# Patient Record
Sex: Male | Born: 1960
Health system: Southern US, Community
[De-identification: ages and names within clinical notes are randomized; demographics above are authoritative.]

## PROBLEM LIST (undated history)

## (undated) DIAGNOSIS — K219 Gastro-esophageal reflux disease without esophagitis: Secondary | ICD-10-CM

## (undated) DIAGNOSIS — K449 Diaphragmatic hernia without obstruction or gangrene: Secondary | ICD-10-CM

## (undated) DIAGNOSIS — K222 Esophageal obstruction: Secondary | ICD-10-CM

## (undated) HISTORY — DX: Esophageal obstruction: K22.2

## (undated) HISTORY — PX: APPENDECTOMY: SHX54

## (undated) HISTORY — DX: Diaphragmatic hernia without obstruction or gangrene: K44.9

## (undated) HISTORY — PX: WRIST SURGERY: SHX841

## (undated) HISTORY — DX: Gastro-esophageal reflux disease without esophagitis: K21.9

---

## 2003-03-29 ENCOUNTER — Ambulatory Visit (HOSPITAL_BASED_OUTPATIENT_CLINIC_OR_DEPARTMENT_OTHER): Admission: RE | Admit: 2003-03-29 | Discharge: 2003-03-29 | Payer: Self-pay | Admitting: Orthopedic Surgery

## 2005-09-05 ENCOUNTER — Emergency Department: Payer: Self-pay | Admitting: Emergency Medicine

## 2005-09-13 ENCOUNTER — Ambulatory Visit: Payer: Self-pay | Admitting: Gastroenterology

## 2005-09-20 ENCOUNTER — Ambulatory Visit: Payer: Self-pay | Admitting: Gastroenterology

## 2011-01-24 ENCOUNTER — Ambulatory Visit: Payer: Self-pay | Admitting: Family Medicine

## 2011-02-14 ENCOUNTER — Ambulatory Visit: Payer: Self-pay | Admitting: Family Medicine

## 2011-07-16 ENCOUNTER — Encounter: Payer: Self-pay | Admitting: Gastroenterology

## 2011-07-24 ENCOUNTER — Encounter: Payer: Self-pay | Admitting: *Deleted

## 2011-07-30 ENCOUNTER — Encounter: Payer: Self-pay | Admitting: Gastroenterology

## 2011-07-30 ENCOUNTER — Ambulatory Visit (INDEPENDENT_AMBULATORY_CARE_PROVIDER_SITE_OTHER): Payer: BC Managed Care – PPO | Admitting: Gastroenterology

## 2011-07-30 VITALS — BP 124/76 | HR 68 | Ht 70.0 in | Wt 215.0 lb

## 2011-07-30 DIAGNOSIS — K219 Gastro-esophageal reflux disease without esophagitis: Secondary | ICD-10-CM

## 2011-07-30 DIAGNOSIS — T17308A Unspecified foreign body in larynx causing other injury, initial encounter: Secondary | ICD-10-CM

## 2011-07-30 DIAGNOSIS — T17910A Gastric contents in respiratory tract, part unspecified causing asphyxiation, initial encounter: Secondary | ICD-10-CM | POA: Insufficient documentation

## 2011-07-30 DIAGNOSIS — R1110 Vomiting, unspecified: Secondary | ICD-10-CM

## 2011-07-30 MED ORDER — METOCLOPRAMIDE HCL 10 MG PO TABS: 10.0000 mg | ORAL_TABLET | Freq: Every day | ORAL | Status: DC

## 2011-07-30 MED ORDER — DEXLANSOPRAZOLE 60 MG PO CPDR: 60.0000 mg | DELAYED_RELEASE_CAPSULE | Freq: Every day | ORAL | Status: AC

## 2011-07-30 NOTE — Progress Notes (Signed)
History of Present Illness:  This is a very pleasant 51 year old Caucasian male who turns 51 in one month. He has had 9 months of rather classical regurgitation and acid reflux, but is has severe nocturnal problems with awakening and aspiration associated with coughing and low-grade fever. The appearance had several episodes of pneumonia over the last year by primary care. So he has classic burning substernal chest pain and postprandial regurgitation without dysphagia. Chart documents over 9 years of acid reflux with recurrent peptic strictures requiring dilation 2004 in 2007. He denies dysphagia or painful swallowing at this time. His appetite is good and his weight is stable. The patient also denies lower gastrointestinal, or hepatobiliary symptoms. He does not smoke, use alcohol or NSAIDs. Family history is noncontributory.  I have reviewed this patient's present history, medical and surgical past history, allergies and medications.     ROS: The remainder of the 10 point ROS is negative.. No symptoms of Raynaud's phenomenon l or cardiovascular or neuromuscular problems.     Physical Exam: Blood pressure 124/76, pulse 66, and BMI 31. General well developed well nourished patient in no acute distress, appearing his stated age Eyes PERRLA, no icterus, fundoscopic exam per opthamologist Skin no lesions noted Neck supple, no adenopathy, no thyroid enlargement, no tenderness Chest clear to percussion and auscultation Heart no significant murmurs, gallops or rubs noted Abdomen no hepatosplenomegaly masses or tenderness, BS normal.  Extremities no acute joint lesions, edema, phlebitis or evidence of cellulitis. Neurologic patient oriented x 3, cranial nerves intact, no focal neurologic deficits noted. Psychological mental status normal and normal affect.  Assessment and plan: Chronic regurgitation with recent aspiration and nocturnal worsening of his GERD. I have placed him on Dexilant 60 mg before  supper with Reglan 10 mg at bedtime, reviewed a strict anti-reflex regime with the patient, he saw our patient education video on acid reflux and its management, and will see me in one month's time. He probably will need repeat endoscopy, colonoscopy for polyp screening, and perhaps esophageal manometry to exclude a chronic esophageal motility disorder. I am concerned about his recurrent aspiration, we may need to consider fundoplication surgery.  Encounter Diagnosis  Name Primary?  . GERD (gastroesophageal reflux disease) Yes

## 2011-07-30 NOTE — Patient Instructions (Signed)
Stop taking your Protonix. Start taking Dexilant 20-30 minutes before supper, samples have been given. Reglan has been sent to your pharmacy. You have watched the movie on GERD today. Return to see Dr Jarold Motto in 1 month.

## 2011-09-10 ENCOUNTER — Ambulatory Visit: Payer: BC Managed Care – PPO | Admitting: Gastroenterology

## 2012-02-28 ENCOUNTER — Encounter: Payer: Self-pay | Admitting: *Deleted

## 2012-03-03 ENCOUNTER — Ambulatory Visit (INDEPENDENT_AMBULATORY_CARE_PROVIDER_SITE_OTHER): Payer: 59 | Admitting: Gastroenterology

## 2012-03-03 ENCOUNTER — Encounter: Payer: Self-pay | Admitting: Gastroenterology

## 2012-03-03 VITALS — BP 90/66 | HR 88 | Ht 69.0 in | Wt 211.1 lb

## 2012-03-03 DIAGNOSIS — K219 Gastro-esophageal reflux disease without esophagitis: Secondary | ICD-10-CM

## 2012-03-03 DIAGNOSIS — Z1211 Encounter for screening for malignant neoplasm of colon: Secondary | ICD-10-CM

## 2012-03-03 MED ORDER — MOVIPREP 100 G PO SOLR: 1.0000 | Freq: Once | ORAL | Status: DC

## 2012-03-03 NOTE — Patient Instructions (Signed)
You have been scheduled for an endoscopy and colonoscopy with propofol. Please follow the written instructions given to you at your visit today. Please pick up your prep at the pharmacy within the next 1-3 days. If you use inhalers (even only as needed), please bring them with you on the day of your procedure. CC: Dr Julieanne Manson

## 2012-03-03 NOTE — Progress Notes (Signed)
History of Present Illness: This is a 51 year old with chronic GERD doing well currently on Protonix 40 mg in the morning. Follow for Reglan at bedtime was not helpful. He is 100% improved from his previous clinic visit, and denies dysphagia or other gastrointestinal or hepatobiliary problems. He has not had previous screening colonoscopy. Family history is noncontributory.    Current Medications, Allergies, Past Medical History, Past Surgical History, Family History and Social History were reviewed in [employer] record.   Assessment and plan: Chronic GERD doing well on PPI therapy along with chronic reflux regime which was again reviewed with the patient. He is due for screening colonoscopy which has been scheduled, and we also will do screening endoscopy to exclude Barrett's mucosa. Protonix prescription refilled. Encounter Diagnoses  Name Primary?  . Screening for colon cancer Yes  . GERD (gastroesophageal reflux disease)

## 2012-03-09 ENCOUNTER — Ambulatory Visit (AMBULATORY_SURGERY_CENTER): Payer: 59 | Admitting: Gastroenterology

## 2012-03-09 ENCOUNTER — Encounter: Payer: Self-pay | Admitting: Gastroenterology

## 2012-03-09 VITALS — BP 107/60 | HR 74 | Temp 96.9°F | Resp 20 | Ht 69.0 in | Wt 211.0 lb

## 2012-03-09 DIAGNOSIS — Z1211 Encounter for screening for malignant neoplasm of colon: Secondary | ICD-10-CM

## 2012-03-09 DIAGNOSIS — K219 Gastro-esophageal reflux disease without esophagitis: Secondary | ICD-10-CM

## 2012-03-09 MED ORDER — SODIUM CHLORIDE 0.9 % IV SOLN: 500.0000 mL | INTRAVENOUS | Status: DC

## 2012-03-09 MED ORDER — PANTOPRAZOLE SODIUM 40 MG PO TBEC: 40.0000 mg | DELAYED_RELEASE_TABLET | Freq: Every day | ORAL | Status: DC

## 2012-03-09 NOTE — Progress Notes (Signed)
Patient did not experience any of the following events: a burn prior to discharge; a fall within the facility; wrong site/side/patient/procedure/implant event; or a hospital transfer or hospital admission upon discharge from the facility. (G8907) Patient did not have preoperative order for IV antibiotic SSI prophylaxis. (G8918)  

## 2012-03-09 NOTE — Progress Notes (Signed)
The pt tolerated the colonoscopy very well. Maw   

## 2012-03-09 NOTE — Patient Instructions (Addendum)
Findings:  Normal Colon, Small Hiatal Hernia Recommendations:  Continue current meds, repeat colonoscopy in 10 years  YOU HAD AN ENDOSCOPIC PROCEDURE TODAY AT THE [facility]: Refer to the procedure report that was given to you for any specific questions about what was found during the examination.  If the procedure report does not answer your questions, please call your gastroenterologist to clarify.  If you requested that your care partner not be given the details of your procedure findings, then the procedure report has been included in a sealed envelope for you to review at your convenience later.  YOU SHOULD EXPECT: Some feelings of bloating in the abdomen. Passage of more gas than usual.  Walking can help get rid of the air that was put into your GI tract during the procedure and reduce the bloating. If you had a lower endoscopy (such as a colonoscopy or flexible sigmoidoscopy) you may notice spotting of blood in your stool or on the toilet paper. If you underwent a bowel prep for your procedure, then you may not have a normal bowel movement for a few days.  DIET: Your first meal following the procedure should be a light meal and then it is ok to progress to your normal diet.  A half-sandwich or bowl of soup is an example of a good first meal.  Heavy or fried foods are harder to digest and may make you feel nauseous or bloated.  Likewise meals heavy in dairy and vegetables can cause extra gas to form and this can also increase the bloating.  Drink plenty of fluids but you should avoid alcoholic beverages for 24 hours.  ACTIVITY: Your care partner should take you home directly after the procedure.  You should plan to take it easy, moving slowly for the rest of the day.  You can resume normal activity the day after the procedure however you should NOT DRIVE or use heavy machinery for 24 hours (because of the sedation medicines used during the test).    SYMPTOMS TO REPORT IMMEDIATELY: A  gastroenterologist can be reached at any hour.  During normal business hours, 8:30 AM to 5:00 PM Monday through Friday, call [phone].  After hours and on weekends, please call the GI answering service at [phone] who will take a message and have the physician on call contact you.   Following lower endoscopy (colonoscopy or flexible sigmoidoscopy):  Excessive amounts of blood in the stool  Significant tenderness or worsening of abdominal pains  Swelling of the abdomen that is new, acute  Fever of 100F or higher  Following upper endoscopy (EGD)  Vomiting of blood or coffee ground material  New chest pain or pain under the shoulder blades  Painful or persistently difficult swallowing  New shortness of breath  Fever of 100F or higher  Black, tarry-looking stools  FOLLOW UP: If any biopsies were taken you will be contacted by phone or by letter within the next 1-3 weeks.  Call your gastroenterologist if you have not heard about the biopsies in 3 weeks.  Our staff will call the home number listed on your records the next business day following your procedure to check on you and address any questions or concerns that you may have at that time regarding the information given to you following your procedure. This is a courtesy call and so if there is no answer at the home number and we have not heard from you through the emergency physician on call, we will  assume that you have returned to your regular daily activities without incident.  SIGNATURES/CONFIDENTIALITY: You and/or your care partner have signed paperwork which will be entered into your electronic medical record.  These signatures attest to the fact that that the information above on your After Visit Summary has been reviewed and is understood.  Full responsibility of the confidentiality of this discharge information lies with you and/or your care-partner.   Please follow all discharge instructions given to you by the recovery  room nurse. If you have any questions or problems after discharge please call one of the numbers listed above. You will receive a phone call in the am to see how you are doing and answer any questions you may have. Thank you for choosing [facility] for your health care needs.

## 2012-03-09 NOTE — Op Note (Signed)
[facility] [address] [place], [address]   ENDOSCOPY PROCEDURE REPORT  PATIENT: Gregory, Avila  MR#: [id number] BIRTHDATE: December 30, 1960 , 51  yrs. old GENDER: Male ENDOSCOPIST:Kendal Raffo Hale Bogus, MD, [facility] REFERRED BY: PROCEDURE DATE:  03/09/2012 PROCEDURE:   EGD, screening ASA CLASS:    Class II INDICATIONS: chronic GERD rule out Barrett's mucosa. MEDICATION: There was residual sedation effect present from prior procedure and Propofol (Diprivan) 140 mg TOPICAL ANESTHETIC:  DESCRIPTION OF PROCEDURE:   After the risks and benefits of the procedure were explained, informed consent was obtained.  The LB GIF-H180 [id number]  endoscope was introduced through the mouth  and advanced to the second portion of the duodenum .  The instrument was slowly withdrawn as the mucosa was fully examined.    The  endoscope was easily inserted into the second duodenum. Examination of the duodenum and stomach was unremarkable.  a small hiatal hernia was observed.  Withdrawal of the endoscope showed no evidence of Barrett's mucosa. or erosive esophagitis.  Patient extubated without difficulty and returned to the recovery for observation.   .     Retroflexed views revealed no abnormalities. The scope was then withdrawn from the patient and the procedure completed.  COMPLICATIONS: There were no complications.   ENDOSCOPIC IMPRESSION: 1.  Theendoscope was easily inserted into the second duodenum. examination of the duodenum and stomach was unremarkable.  a small hiatal hernia was observed.  withdrawal of the endoscope showed no evidence of Barrett's mucosa. 2. Treated GERD RECOMMENDATIONS: 1.  continue current medications 2.  continue PPI    _______________________________ eSigned:  Mardella Layman, MD, [facility] 03/09/2012 3:45 PM   antireflux   PATIENT NAME:  Gregory, Avila MR#: [id number]

## 2012-03-09 NOTE — Op Note (Signed)
[facility] [address] [place], [address]   COLONOSCOPY PROCEDURE REPORT  PATIENT: Gregory Avila, Gregory Avila  MR#: [id number] BIRTHDATE: 10/03/1960 , 51  yrs. old GENDER: Male ENDOSCOPIST: Mardella Layman, MD, [facility] REFERRED BY: PROCEDURE DATE:  03/09/2012 PROCEDURE:   Colonoscopy, screening ASA CLASS:   Class II INDICATIONS:average risk patient for colon cancer. MEDICATIONS: Propofol (Diprivan) 260 mg IV  DESCRIPTION OF PROCEDURE:   After the risks and benefits and of the procedure were explained, informed consent was obtained.  A digital rectal exam revealed no abnormalities of the rectum.    The LB CF-H180AL [id number]  endoscope was introduced through the anus and advanced to the cecum, which was identified by both the appendix and ileocecal valve .  The quality of the prep was excellent, using MoviPrep .  The instrument was then slowly withdrawn as the colon was fully examined.     COLON FINDINGS: A normal appearing cecum, ileocecal valve, and appendiceal orifice were identified.  The ascending, hepatic flexure, transverse, splenic flexure, descending, sigmoid colon and rectum appeared unremarkable.  No polyps or cancers were seen. Retroflexed views revealed no abnormalities.     The scope was then withdrawn from the patient and the procedure completed.  COMPLICATIONS: There were no complications. ENDOSCOPIC IMPRESSION: Normal colon,no polyps or cancer  RECOMMENDATIONS: Continue current colorectal screening recommendations for "routine risk" patients with a repeat colonoscopy in 10 years.   REPEAT EXAM:  XB:[id number] Sullivan Lone, MD  _______________________________ eSigned:  Mardella Layman, MD, [facility] 03/09/2012 3:34 PM

## 2012-03-09 NOTE — Progress Notes (Signed)
The pt tolerated the egd very well. Maw   

## 2012-03-10 ENCOUNTER — Telehealth: Payer: Self-pay | Admitting: *Deleted

## 2012-03-10 NOTE — Telephone Encounter (Signed)
  Follow up Call-  Call back number 03/09/2012  Post procedure Call Back phone  # [phone]  Permission to leave phone message Yes     Patient questions:  Do you have a fever, pain , or abdominal swelling? no Pain Score  0 *  Have you tolerated food without any problems? yes  Have you been able to return to your normal activities? yes  Do you have any questions about your discharge instructions: Diet   no Medications  no Follow up visit  no  Do you have questions or concerns about your Care? no  Actions: * If pain score is 4 or above: No action needed, pain <4.

## 2013-03-08 ENCOUNTER — Telehealth: Payer: Self-pay | Admitting: Cardiology

## 2013-03-08 ENCOUNTER — Emergency Department: Payer: Self-pay | Admitting: Emergency Medicine

## 2013-03-08 LAB — CBC
HCT: 42.1 % (ref 40.0–52.0)
HGB: 14.9 g/dL (ref 13.0–18.0)
MCHC: 35.5 g/dL (ref 32.0–36.0)
MCV: 83 fL (ref 80–100)
RDW: 13 % (ref 11.5–14.5)
WBC: 6.2 10*3/uL (ref 3.8–10.6)

## 2013-03-08 LAB — COMPREHENSIVE METABOLIC PANEL
Albumin: 3.7 g/dL (ref 3.4–5.0)
Alkaline Phosphatase: 97 U/L (ref 50–136)
Anion Gap: 6 — ABNORMAL LOW (ref 7–16)
BUN: 19 mg/dL — ABNORMAL HIGH (ref 7–18)
Calcium, Total: 8.9 mg/dL (ref 8.5–10.1)
Chloride: 108 mmol/L — ABNORMAL HIGH (ref 98–107)
Co2: 25 mmol/L (ref 21–32)
EGFR (African American): >60
Glucose: 109 mg/dL — ABNORMAL HIGH (ref 65–99)
Osmolality: 280 (ref 275–301)
SGOT(AST): 30 U/L (ref 15–37)
SGPT (ALT): 28 U/L (ref 12–78)
Total Protein: 7.2 g/dL (ref 6.4–8.2)

## 2013-03-08 LAB — TROPONIN I: Troponin-I: <0.02 ng/mL

## 2013-03-08 NOTE — Telephone Encounter (Signed)
I notified [place] office that patient going to [facility]

## 2013-03-08 NOTE — Telephone Encounter (Signed)
Received call directly from [facility] from patient's wife who states patient is having left-sided chest pain that began at 0100 today.  Patient got on the phone and described pain as woke up to a "squeezing" sensation in his chest.  Patient states pain radiated into his neck, left shoulder and down left arm.  Patient states his wife gave him 4 baby ASA and the pain eased off some.  Patient states pain is currently worse than it was earlier this morning and is described as a dull ache and throb and is also felt in his rib cage.  Patient rates pain 2 on 0-10 scale and states he is SOB, denies n/v, diaphoresis.  Patient states a stressful event occurred this weekend and he has not been sleeping well.  I discussed the concern for cardiac chest pain and that I recommend patient go to the ER for further evaluation.  Patient questioned the difference between [facility] and [facility] and I advised that we are the same cardiology practice with doctors in both [place] and [place].  Patient states he will go to [place] for evaluation.  I spoke with patient's wife again who also verbalized understanding and agreement with plan.

## 2013-03-08 NOTE — Telephone Encounter (Signed)
New Problem:  Pt's wife states he is having chest pain, shoulder pain... Pt would like to be advised.Marland KitchenMarland Kitchen

## 2013-03-09 ENCOUNTER — Telehealth: Payer: Self-pay
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
## 2013-03-15 ENCOUNTER — Encounter: Payer: Self-pay | Admitting: Cardiovascular Disease

## 2013-03-15 ENCOUNTER — Encounter (INDEPENDENT_AMBULATORY_CARE_PROVIDER_SITE_OTHER): Payer: Self-pay

## 2013-03-15 ENCOUNTER — Ambulatory Visit (INDEPENDENT_AMBULATORY_CARE_PROVIDER_SITE_OTHER): Payer: 59 | Admitting: Cardiovascular Disease

## 2013-03-15 VITALS — BP 100/82 | HR 69 | Ht 69.0 in | Wt 218.5 lb

## 2013-03-15 DIAGNOSIS — R079 Chest pain, unspecified: Secondary | ICD-10-CM

## 2013-03-15 DIAGNOSIS — R0789 Other chest pain: Secondary | ICD-10-CM | POA: Insufficient documentation
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
## 2013-03-15 NOTE — Patient Instructions (Signed)
You are doing well. No medication changes were made.  Consider Red Yeast Rice for cholesterol  Please call the office if you have worsening chest pain  Please call us if you have new issues that need to be addressed before your next appt.

## 2013-04-05 ENCOUNTER — Telehealth: Payer: Self-pay

## 2013-04-05 NOTE — Telephone Encounter (Signed)
Message copied by Marilynne Halsted on Mon Apr 05, 2013 11:01 AM ------      Message from: Antonieta Iba      Created: Sun Apr 04, 2013  9:47 PM       Can we let him know chest xray looks ok ------

## 2013-04-05 NOTE — Telephone Encounter (Signed)
Left detailed message on pt's voicemail with results.  Asked him to call with any questions or concerns.

## 2013-05-10 ENCOUNTER — Ambulatory Visit: Payer: Self-pay | Admitting: Family Medicine

## 2013-12-31 IMAGING — CR DG CHEST 2V
1 series · 2 of 2 positions shown · non-contrast
Comparison: none

REASON FOR EXAM: Chest Pain
COMMENTS:

[Series 1: w chest pa · 0.14mm/px · 2 of 2 slices shown]
[im 1/2]
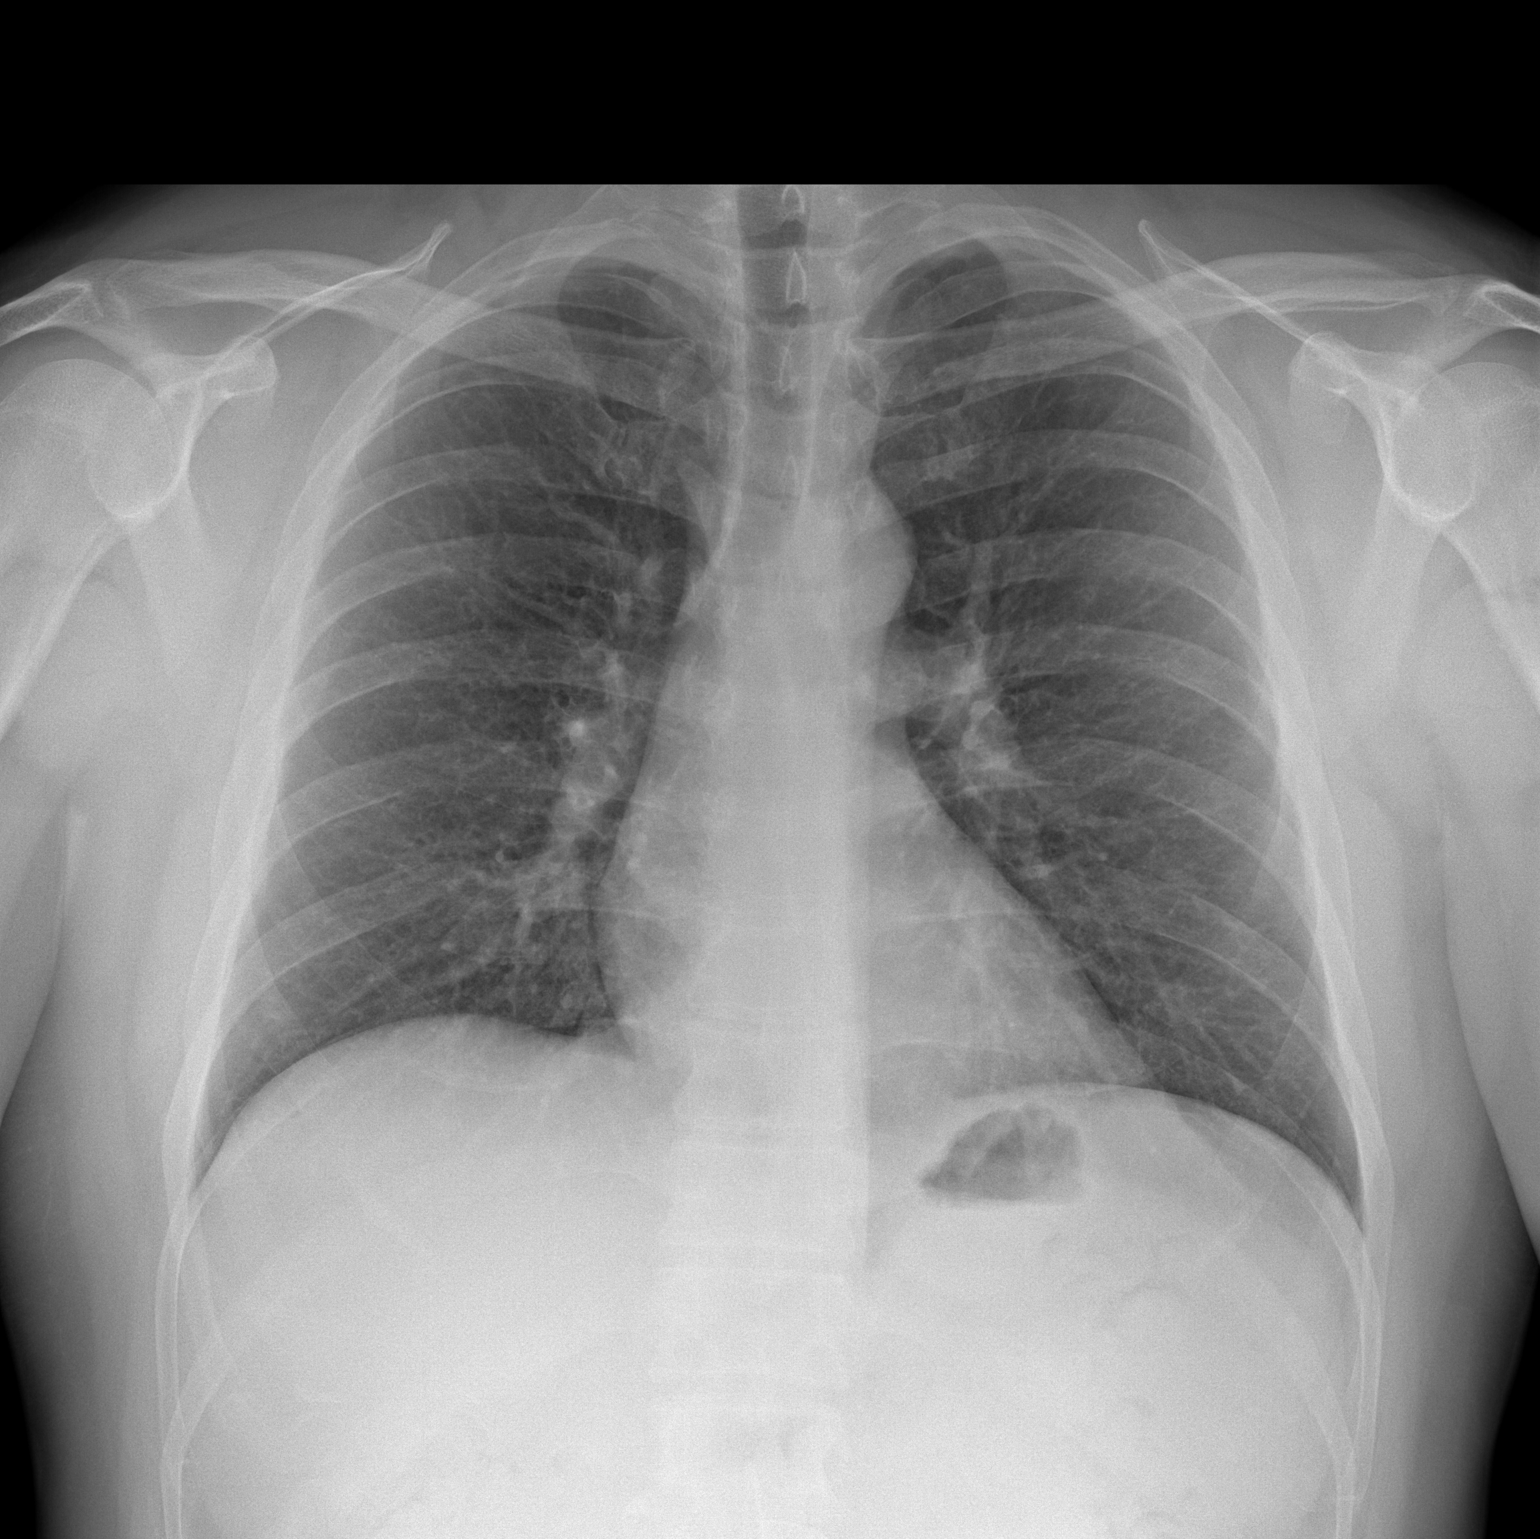
[im 2/2]
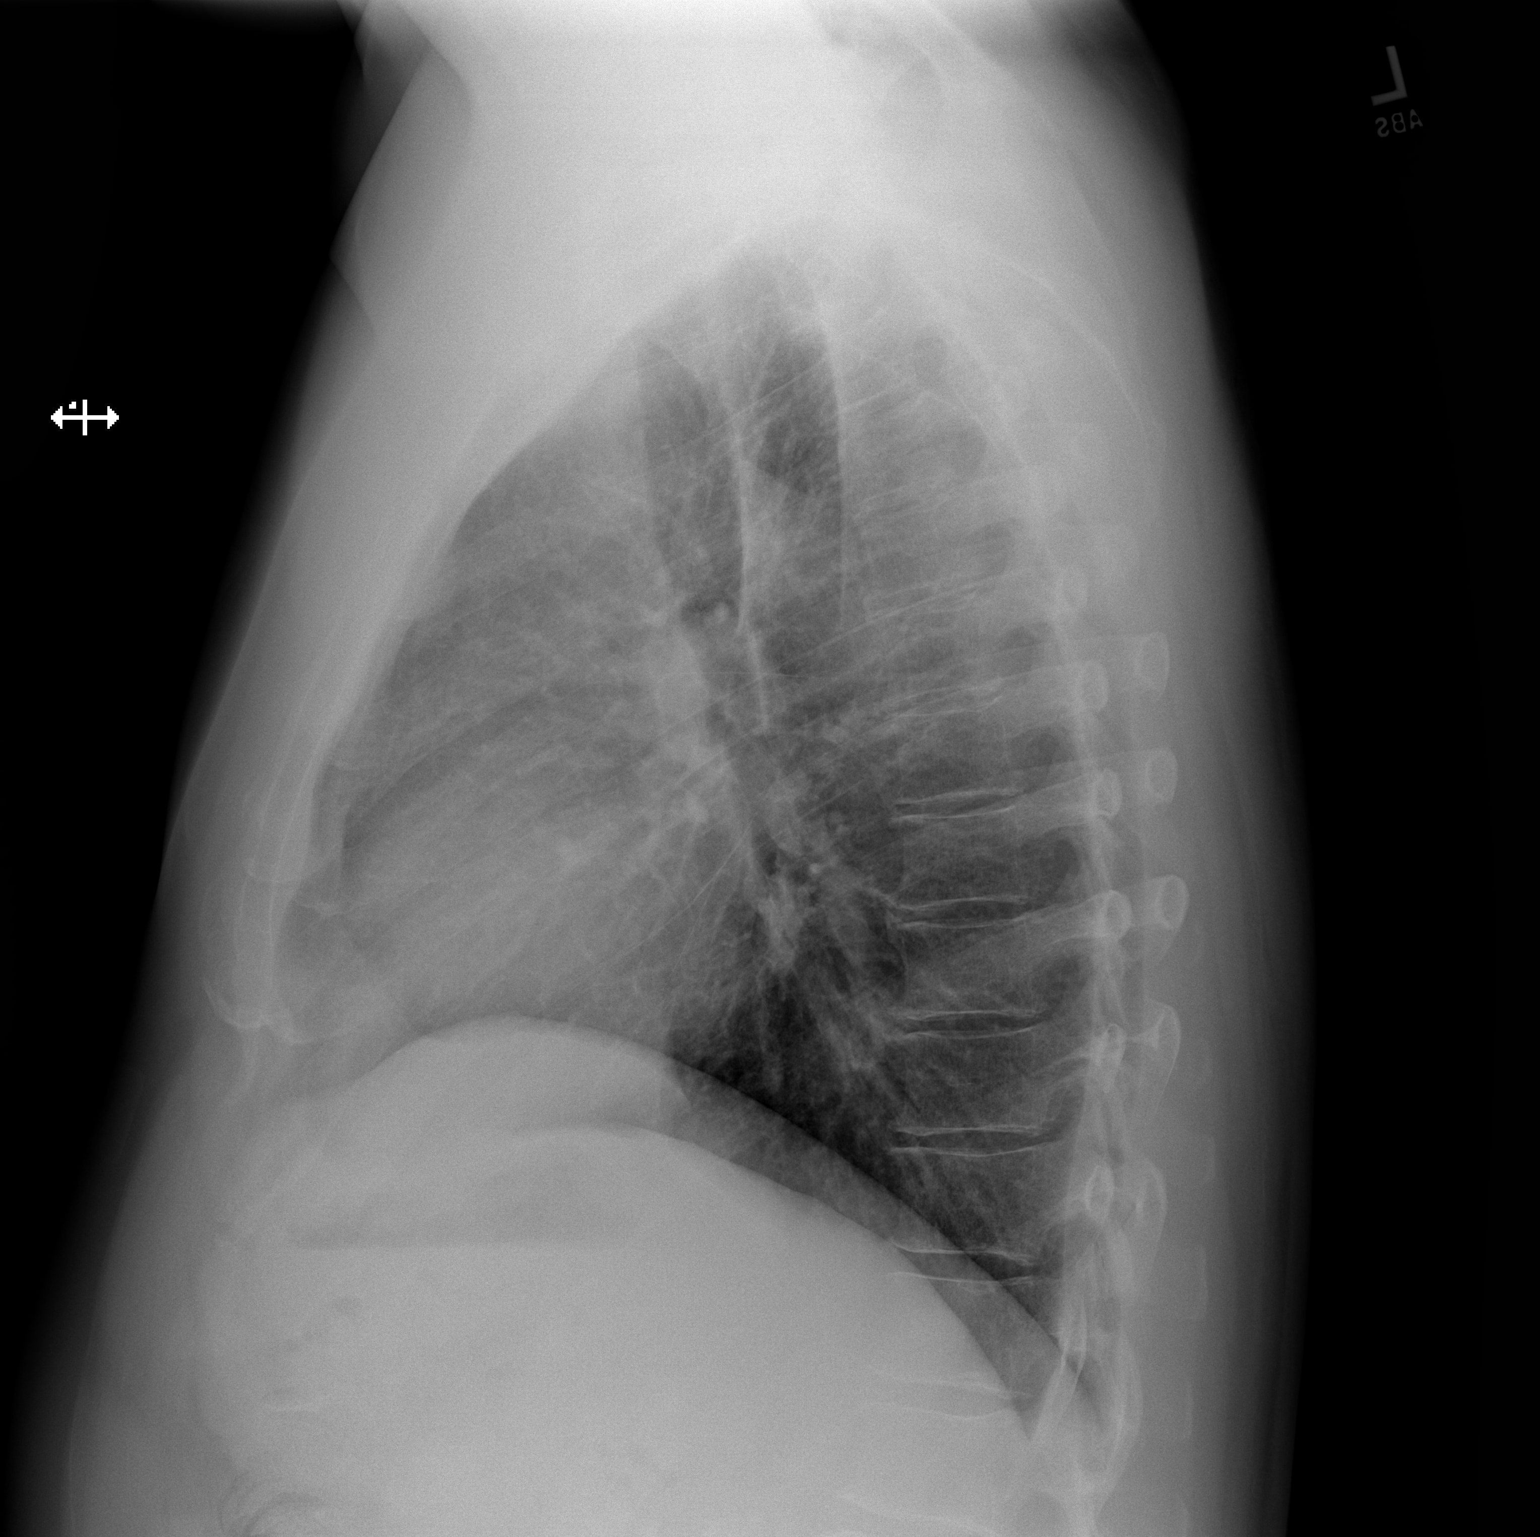

[2 of 2 positions shown; findings below may reference images not displayed]

PROCEDURE:     DXR - DXR CHEST PA (OR AP) AND LATERAL  - March 08, 2013 [DATE]

RESULT:     Comparison is made to the study of 02/14/2011.

The lungs are clear. The heart and pulmonary vessels are normal. The bony
and mediastinal structures are unremarkable. There is no effusion. There is
no pneumothorax or evidence of congestive failure.
IMPRESSION: No acute cardiopulmonary disease. Stable appearance.

[REDACTED]

## 2014-03-04 IMAGING — CR DG CHEST 2V
1 series · 2 of 2 positions shown · non-contrast
Comparison: 03/08/2013.

CLINICAL DATA: Cough, fever, shortness of breath and chest pain.

EXAM:
CHEST  2 VIEW

[Series 1: pa · 0.17mm/px · 2 of 2 slices shown]
[im 1/2]
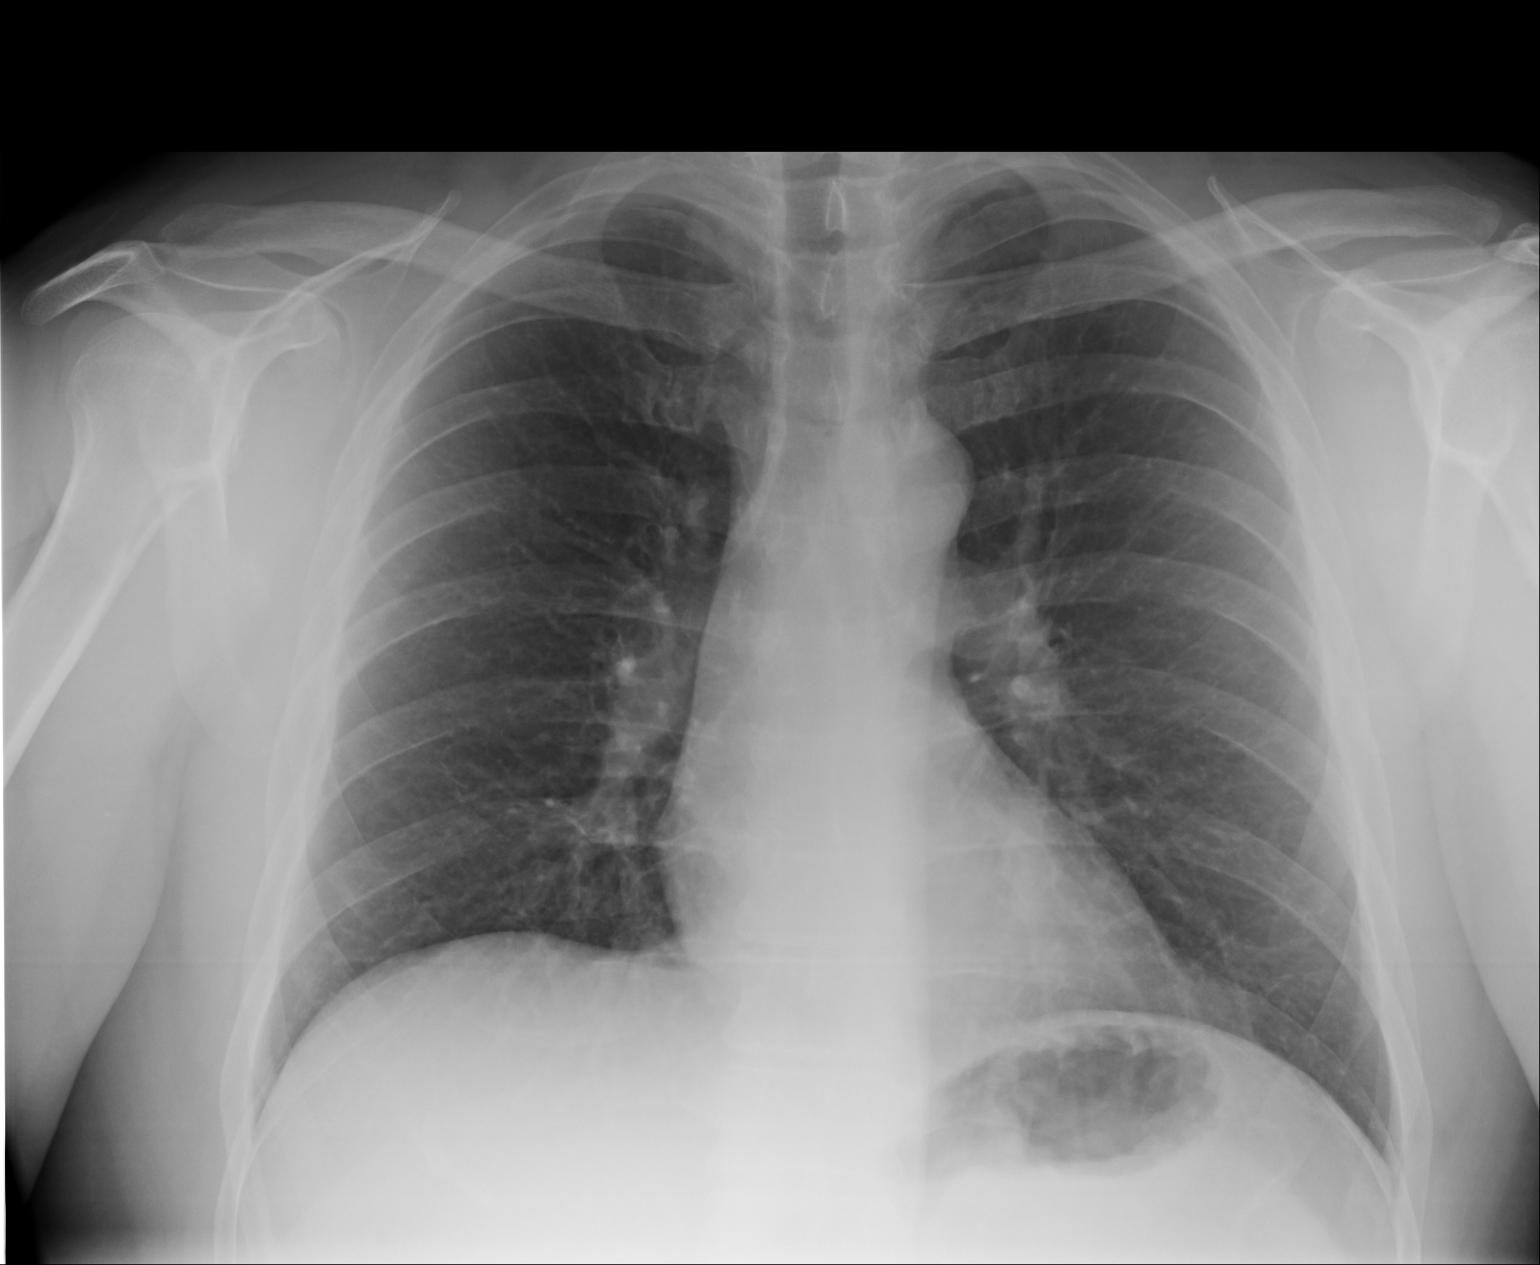
[im 2/2]
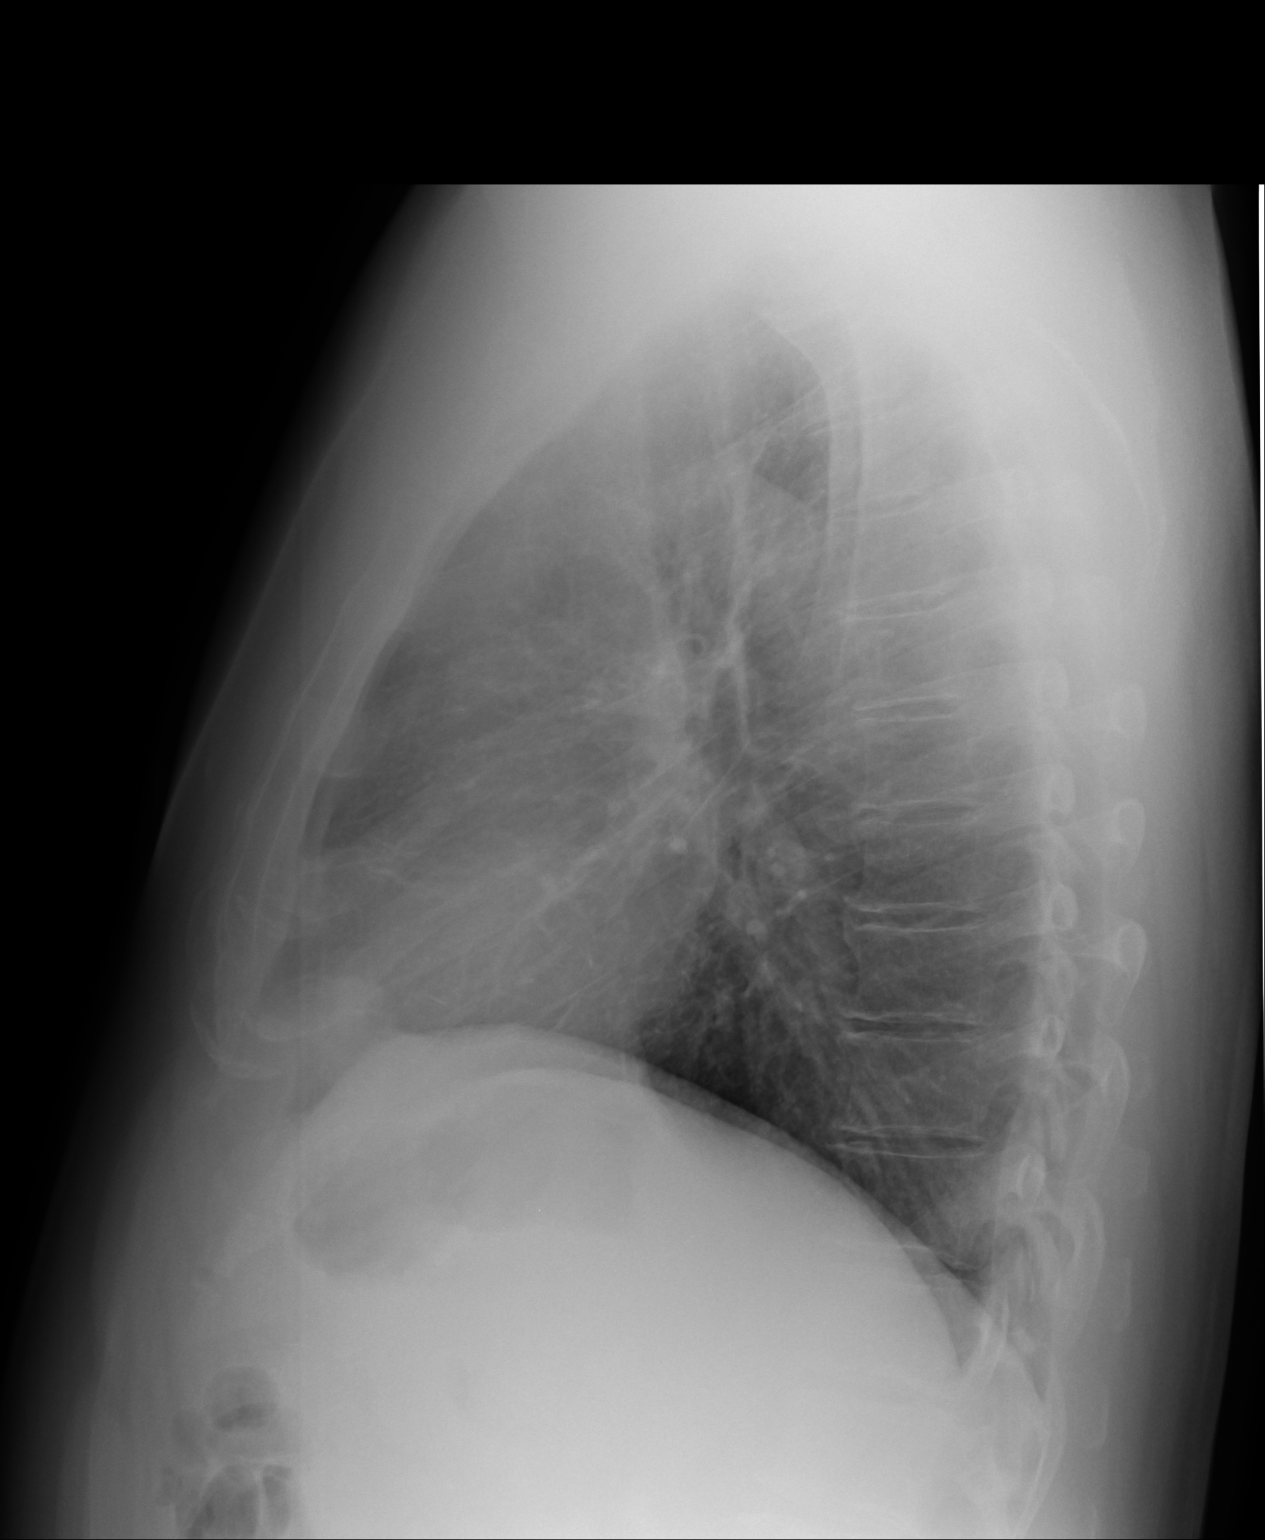

[2 of 2 positions shown; findings below may reference images not displayed]

FINDINGS: Trachea is midline. Heart size normal. Biapical pleural thickening
is mild. Lungs are clear. No pleural fluid. Mild degenerative
changes are scattered in the spine.
IMPRESSION: No acute findings.

## 2014-08-04 LAB — CBC AND DIFFERENTIAL
HEMATOCRIT: 46 % (ref 41–53)
Hemoglobin: 15.7 g/dL (ref 13.5–17.5)
NEUTROS ABS: 3 /uL
PLATELETS: 280 10*3/uL (ref 150–399)
WBC: 5.4 10*3/mL

## 2014-08-04 LAB — HEPATIC FUNCTION PANEL
ALK PHOS: 94 U/L (ref 25–125)
ALT: 22 U/L (ref 10–40)
AST: 23 U/L (ref 14–40)
Bilirubin, Total: 0.6 mg/dL

## 2014-08-04 LAB — BASIC METABOLIC PANEL
BUN: 14 mg/dL (ref 4–21)
Creatinine: 1.2 mg/dL (ref 0.6–1.3)
GLUCOSE: 98 mg/dL
Potassium: 4.7 mmol/L (ref 3.4–5.3)
Sodium: 141 mmol/L (ref 137–147)

## 2014-08-04 LAB — PSA: PSA: 1.4

## 2014-08-04 LAB — LIPID PANEL
CHOLESTEROL: 222 mg/dL — AB (ref 0–200)
HDL: 45 mg/dL (ref 35–70)
LDL CALC: 142 mg/dL
LDl/HDL Ratio: 3.2
Triglycerides: 177 mg/dL — AB (ref 40–160)

## 2015-05-24 ENCOUNTER — Other Ambulatory Visit: Payer: Self-pay | Admitting: Family Medicine

## 2015-06-20 ENCOUNTER — Other Ambulatory Visit: Payer: Self-pay

## 2015-06-20 MED ORDER — PANTOPRAZOLE SODIUM 20 MG PO TBEC: 20.0000 mg | DELAYED_RELEASE_TABLET | Freq: Every day | ORAL | Status: DC

## 2015-07-13 ENCOUNTER — Other Ambulatory Visit: Payer: Self-pay

## 2015-07-13 MED ORDER — PANTOPRAZOLE SODIUM 20 MG PO TBEC: 20.0000 mg | DELAYED_RELEASE_TABLET | Freq: Every day | ORAL | Status: DC

## 2015-11-20 ENCOUNTER — Encounter: Payer: Self-pay | Admitting: Family Medicine

## 2015-11-20 ENCOUNTER — Ambulatory Visit (INDEPENDENT_AMBULATORY_CARE_PROVIDER_SITE_OTHER): Payer: 59 | Admitting: Family Medicine

## 2015-11-20 VITALS — BP 108/72 | HR 70 | Temp 98.5°F | Resp 12 | Wt 221.0 lb

## 2015-11-20 DIAGNOSIS — R05 Cough: Secondary | ICD-10-CM

## 2015-11-20 DIAGNOSIS — J410 Simple chronic bronchitis: Secondary | ICD-10-CM

## 2015-11-20 DIAGNOSIS — R059 Cough, unspecified: Secondary | ICD-10-CM

## 2015-11-20 MED ORDER — AZITHROMYCIN 250 MG PO TABS: ORAL_TABLET | ORAL | Status: DC

## 2015-11-20 NOTE — Progress Notes (Signed)
Subjective:  HPI  Patient states he has had a persistent cough for 3 weeks now. Cough is dry with about 25 to 30% of the time of productivity. He has not had any other symptoms and is not sure why he is having this cough. He has not taking any medications OTC for current symptoms. No fever or chills or shortness of breath. Prior to Admission medications   Medication Sig Start Date End Date Taking? Authorizing Provider  pantoprazole (PROTONIX) 20 MG tablet Take 1 tablet (20 mg total) by mouth daily. Take 1 tablet daily not 2 -due to insurance coverage 07/13/15  Yes Richard Hulen Shouts., MD  ranitidine (ZANTAC) 150 MG capsule Take 150 mg by mouth 2 (two) times daily.   Yes Historical Provider, MD    Patient Active Problem List   Diagnosis Date Noted  . Chest tightness 03/15/2013  . GERD (gastroesophageal reflux disease) 07/30/2011  . Gastric regurgitation 07/30/2011  . Aspiration of gastric contents 07/30/2011    Past Medical History  Diagnosis Date  . Schatzki's ring   . Hiatal hernia   . Esophageal reflux   . Peptic stricture of esophagus     Social History   Social History  . Marital Status: Married    Spouse Name: N/A  . Number of Children: 4  . Years of Education: N/A   Occupational History  . consultant    Social History Main Topics  . Smoking status: Never Smoker   . Smokeless tobacco: Never Used  . Alcohol Use: No  . Drug Use: No  . Sexual Activity: Not on file   Other Topics Concern  . Not on file   Social History Narrative   Occ [employer]     No Known Allergies  Review of Systems  Constitutional: Negative.   HENT: Negative.   Respiratory: Positive for cough and sputum production. Negative for hemoptysis, shortness of breath and wheezing.   Cardiovascular: Negative.   Gastrointestinal: Negative.   Musculoskeletal: Negative.   Neurological: Negative.   Psychiatric/Behavioral: Negative.     Immunization History  Administered Date(s)  Administered  . Tdap 08/02/2013   Objective:  BP 108/72 mmHg  Pulse 70  Temp(Src) 98.5 F (36.9 C)  Resp 12  Wt 221 lb (100.245 kg)  SpO2 96%  Physical Exam  Constitutional: He is oriented to person, place, and time and well-developed, well-nourished, and in no distress.  HENT:  Head: Normocephalic and atraumatic.  Right Ear: External ear normal.  Left Ear: External ear normal.  Mouth/Throat: Oropharynx is clear and moist.  Eyes: Conjunctivae are normal. Pupils are equal, round, and reactive to light.  Neck: Normal range of motion. Neck supple.  Cardiovascular: Normal rate, regular rhythm, normal heart sounds and intact distal pulses.   Pulmonary/Chest: Effort normal and breath sounds normal. No respiratory distress. He has no wheezes.  Musculoskeletal: He exhibits no edema or tenderness.  Neurological: He is alert and oriented to person, place, and time.  Psychiatric: Mood, memory, affect and judgment normal.    Lab Results  Component Value Date   WBC 5.4 08/04/2014   HGB 15.7 08/04/2014   HCT 46 08/04/2014   PLT 280 08/04/2014   GLUCOSE 109* 03/08/2013   CHOL 222* 08/04/2014   TRIG 177* 08/04/2014   HDL 45 08/04/2014   LDLCALC 142 08/04/2014   PSA 1.4 08/04/2014    CMP     Component Value Date/Time   NA 141 08/04/2014   NA 139 03/08/2013 1012  K 4.7 08/04/2014   K 4.0 03/08/2013 1012   CL 108* 03/08/2013 1012   CO2 25 03/08/2013 1012   GLUCOSE 109* 03/08/2013 1012   BUN 14 08/04/2014   BUN 19* 03/08/2013 1012   CREATININE 1.2 08/04/2014   CREATININE 1.25 03/08/2013 1012   CALCIUM 8.9 03/08/2013 1012   PROT 7.2 03/08/2013 1012   ALBUMIN 3.7 03/08/2013 1012   AST 23 08/04/2014   AST 30 03/08/2013 1012   ALT 22 08/04/2014   ALT 28 03/08/2013 1012   ALKPHOS 94 08/04/2014   ALKPHOS 97 03/08/2013 1012   BILITOT 0.3 03/08/2013 1012   GFRNONAA >60 03/08/2013 1012   GFRAA >60 03/08/2013 1012    Assessment and Plan :  Cough Chronic bronchitis Will  treat with Zpak. Chest Xray is not needed at this time per my opinion and patient agrees. Will follow as needed. Exam is normal today. If symptoms do not get worse or persist will get xray then.  Patient was seen and examined by Dr. Bosie Closichard L Gilbert and note was scribed by Samara DeistAnastasiya Aleksandrova, RMA.   Julieanne Mansonichard Gilbert MD [facility] 11/20/2015 4:05 PM

## 2015-12-28 ENCOUNTER — Ambulatory Visit (INDEPENDENT_AMBULATORY_CARE_PROVIDER_SITE_OTHER): Payer: 59 | Admitting: Family Medicine

## 2015-12-28 ENCOUNTER — Encounter: Payer: Self-pay | Admitting: Family Medicine

## 2015-12-28 VITALS — BP 128/72 | HR 64 | Temp 98.1°F | Resp 16 | Ht 69.0 in | Wt 223.0 lb

## 2015-12-28 DIAGNOSIS — Z1211 Encounter for screening for malignant neoplasm of colon: Secondary | ICD-10-CM | POA: Diagnosis not present

## 2015-12-28 DIAGNOSIS — R0683 Snoring: Secondary | ICD-10-CM

## 2015-12-28 DIAGNOSIS — Z125 Encounter for screening for malignant neoplasm of prostate: Secondary | ICD-10-CM

## 2015-12-28 DIAGNOSIS — R05 Cough: Secondary | ICD-10-CM

## 2015-12-28 DIAGNOSIS — R059 Cough, unspecified: Secondary | ICD-10-CM

## 2015-12-28 DIAGNOSIS — Z0000 Encounter for general adult medical examination without abnormal findings: Secondary | ICD-10-CM | POA: Diagnosis not present

## 2015-12-28 LAB — POCT URINALYSIS DIPSTICK
Bilirubin, UA: NEGATIVE
Blood, UA: NEGATIVE
Glucose, UA: NEGATIVE
KETONES UA: NEGATIVE
LEUKOCYTES UA: NEGATIVE
NITRITE UA: NEGATIVE
PH UA: 6
PROTEIN UA: NEGATIVE
Spec Grav, UA: 1.025
Urobilinogen, UA: 0.2

## 2015-12-28 LAB — IFOBT (OCCULT BLOOD): IMMUNOLOGICAL FECAL OCCULT BLOOD TEST: NEGATIVE

## 2015-12-28 NOTE — Progress Notes (Signed)
Patient: Gregory Avila, Male    DOB: [birth year], 55 y.o.   MRN: [id number] Visit Date: 12/28/2015  Today's Provider: Megan Mansichard Gilbert Jr, MD   Chief Complaint  Patient presents with  . Annual Exam  . Cough   Subjective:    Annual physical exam Gregory LeatherwoodScott H Divis is a 55 y.o. male who presents today for health maintenance and complete physical. He feels well. He reports exercising not usually. He reports he is sleeping well.  Cough  Patient also wants to discuss his cough that he has had for over 1 month. Patient reports that he was treated with a Zpak on 11/20/2015, but feels he never got completely better. Patient reports that he has a dry, hacking cough. Denies fever or shortness of breath.   Review of Systems  Constitutional: Negative.   HENT: Negative.   Eyes: Negative.   Respiratory: Positive for cough. Negative for apnea, choking, chest tightness, shortness of breath, wheezing and stridor.   Cardiovascular: Negative.   Gastrointestinal: Negative.   Endocrine: Negative.   Genitourinary: Negative.   Musculoskeletal: Negative.   Skin: Negative.   Allergic/Immunologic: Negative.   Neurological: Negative.   Hematological: Negative.   Psychiatric/Behavioral: Negative.     Social History      He  reports that he has never smoked. He has never used smokeless tobacco. He reports that he does not drink alcohol or use drugs.       Social History   Social History  . Marital status: Married    Spouse name: N/A  . Number of children: 4  . Years of education: N/A   Occupational History  . consultant    Social History Main Topics  . Smoking status: Never Smoker  . Smokeless tobacco: Never Used  . Alcohol use No  . Drug use: No  . Sexual activity: Not Asked   Other Topics Concern  . None   Social History Narrative   Occ [employer]     Past Medical History:  Diagnosis Date  . Esophageal reflux   . Hiatal hernia   . Peptic stricture of esophagus   .  Schatzki's ring      Patient Active Problem List   Diagnosis Date Noted  . Chest tightness 03/15/2013  . GERD (gastroesophageal reflux disease) 07/30/2011  . Gastric regurgitation 07/30/2011  . Aspiration of gastric contents 07/30/2011    Past Surgical History:  Procedure Laterality Date  . APPENDECTOMY    . WRIST SURGERY     right x3, plate    Family History        Family Status  Relation Status  . Mother Alive  . Father Deceased  . Brother Alive  . Paternal Grandfather   . Neg Hx         His family history includes Breast cancer in his mother; Other in his father; Pancreatic cancer in his paternal grandfather.    No Known Allergies  Current Meds  Medication Sig  . pantoprazole (PROTONIX) 20 MG tablet Take 1 tablet (20 mg total) by mouth daily. Take 1 tablet daily not 2 -due to insurance coverage  . ranitidine (ZANTAC) 150 MG capsule Take 150 mg by mouth 2 (two) times daily.    Patient Care Team: Maple Hudsonichard L Gilbert Jr., MD as PCP - General (Unknown Physician Specialty)     Objective:   Vitals: BP 128/72 (BP Location: Right Arm, Patient Position: Sitting, Cuff Size: Normal)   Pulse 64  Temp 98.1 F (36.7 C)   Resp 16   Ht 5\' 9"  (1.753 m)   Wt 223 lb (101.2 kg)   BMI 32.93 kg/m    Physical Exam  Constitutional: He is oriented to person, place, and time. He appears well-developed and well-nourished.  Pleasant, mildly obese white male in no acute distress  HENT:  Head: Normocephalic and atraumatic.  Right Ear: External ear normal.  Left Ear: External ear normal.  Nose: Nose normal.  Mouth/Throat: Oropharynx is clear and moist.  Eyes: Conjunctivae are normal. Pupils are equal, round, and reactive to light. No scleral icterus.  Neck: Neck supple. No thyromegaly present.  Cardiovascular: Normal rate, regular rhythm and normal heart sounds.   Pulmonary/Chest: Effort normal and breath sounds normal.  Abdominal: Soft. Bowel sounds are normal.    Genitourinary: Rectum normal, prostate normal and penis normal. Rectal exam shows guaiac negative stool.  Musculoskeletal: He exhibits no edema.  Lymphadenopathy:    He has no cervical adenopathy.  Neurological: He is alert and oriented to person, place, and time. No cranial nerve deficit. He exhibits normal muscle tone. Coordination normal.  Skin: Skin is warm and dry.  Psychiatric: He has a normal mood and affect. His behavior is normal. Judgment and thought content normal.      Assessment & Plan:     Routine Health Maintenance and Physical Exam  Exercise Activities and Dietary recommendations Goals    None      Immunization History  Administered Date(s) Administered  . Tdap 08/02/2013    Health Maintenance  Topic Date Due  . HIV Screening  08/26/1976  . INFLUENZA VACCINE  12/12/2015  . COLONOSCOPY  03/11/2022  . TETANUS/TDAP  08/03/2023  . Hepatitis C Screening  Completed      Discussed health benefits of physical activity, and encouraged him to engage in regular exercise appropriate for his age and condition.    1. Annual physical exam Overall health is good. Diet and exercise habits discussed with patient. - Comprehensive metabolic panel - Lipid panel - TSH - POCT urinalysis dipstick  2. Cough - CBC with Differential/Platelet  3. Snoring/Possible obstructive sleep apnea Patient scored a 9 on Epworth Sleepines Scale. Consider work up for sleep apnea if symptoms continue.  Patient may need 4. Screening for prostate cancer - PSA  5. Screening for colon cancer - IFOBT POC (occult bld, rslt in office) 6. Mild obesity Diet and exercise habits discussed.   I have done the exam and reviewed the above chart and it is accurate to the best of my knowledge.  Richard Wendelyn BreslowGilbert Jr, MD  [facility]

## 2015-12-29 LAB — COMPREHENSIVE METABOLIC PANEL
ALT: 23 IU/L (ref 0–44)
AST: 21 IU/L (ref 0–40)
Albumin/Globulin Ratio: 1.7 (ref 1.2–2.2)
Albumin: 4.3 g/dL (ref 3.5–5.5)
Alkaline Phosphatase: 89 IU/L (ref 39–117)
BUN/Creatinine Ratio: 12 (ref 9–20)
BUN: 14 mg/dL (ref 6–24)
Bilirubin Total: 0.4 mg/dL (ref 0.0–1.2)
CALCIUM: 9.8 mg/dL (ref 8.7–10.2)
CO2: 22 mmol/L (ref 18–29)
CREATININE: 1.21 mg/dL (ref 0.76–1.27)
Chloride: 102 mmol/L (ref 96–106)
GFR calc Af Amer: 78 mL/min/{1.73_m2} (ref >59)
GFR, EST NON AFRICAN AMERICAN: 67 mL/min/{1.73_m2} (ref >59)
GLOBULIN, TOTAL: 2.6 g/dL (ref 1.5–4.5)
Glucose: 94 mg/dL (ref 65–99)
Potassium: 5.1 mmol/L (ref 3.5–5.2)
Sodium: 142 mmol/L (ref 134–144)
Total Protein: 6.9 g/dL (ref 6.0–8.5)

## 2015-12-29 LAB — PSA: Prostate Specific Ag, Serum: 1.8 ng/mL (ref 0.0–4.0)

## 2015-12-29 LAB — CBC WITH DIFFERENTIAL/PLATELET
Basophils Absolute: 0 10*3/uL (ref 0.0–0.2)
Basos: 0 %
EOS (ABSOLUTE): 0.4 10*3/uL (ref 0.0–0.4)
EOS: 6 %
HEMATOCRIT: 43.6 % (ref 37.5–51.0)
Hemoglobin: 15.3 g/dL (ref 12.6–17.7)
IMMATURE GRANS (ABS): 0 10*3/uL (ref 0.0–0.1)
IMMATURE GRANULOCYTES: 0 %
LYMPHS: 29 %
Lymphocytes Absolute: 1.6 10*3/uL (ref 0.7–3.1)
MCH: 29.4 pg (ref 26.6–33.0)
MCHC: 35.1 g/dL (ref 31.5–35.7)
MCV: 84 fL (ref 79–97)
MONOCYTES: 8 %
MONOS ABS: 0.5 10*3/uL (ref 0.1–0.9)
NEUTROS PCT: 57 %
Neutrophils Absolute: 3.1 10*3/uL (ref 1.4–7.0)
PLATELETS: 264 10*3/uL (ref 150–379)
RBC: 5.21 x10E6/uL (ref 4.14–5.80)
RDW: 13.1 % (ref 12.3–15.4)
WBC: 5.6 10*3/uL (ref 3.4–10.8)

## 2015-12-29 LAB — LIPID PANEL
CHOL/HDL RATIO: 4.4 ratio (ref 0.0–5.0)
Cholesterol, Total: 192 mg/dL (ref 100–199)
HDL: 44 mg/dL (ref >39)
LDL CALC: 120 mg/dL — AB (ref 0–99)
TRIGLYCERIDES: 142 mg/dL (ref 0–149)
VLDL Cholesterol Cal: 28 mg/dL (ref 5–40)

## 2015-12-29 LAB — TSH: TSH: 2.18 u[IU]/mL (ref 0.450–4.500)

## 2016-05-11 ENCOUNTER — Other Ambulatory Visit: Payer: Self-pay | Admitting: Family Medicine

## 2017-02-11 ENCOUNTER — Other Ambulatory Visit: Payer: Self-pay | Admitting: Family Medicine

## 2017-05-14 ENCOUNTER — Other Ambulatory Visit: Payer: Self-pay | Admitting: Family Medicine

## 2017-06-24 ENCOUNTER — Ambulatory Visit (INDEPENDENT_AMBULATORY_CARE_PROVIDER_SITE_OTHER): Payer: 59 | Admitting: Family Medicine

## 2017-06-24 ENCOUNTER — Encounter: Payer: Self-pay | Admitting: Family Medicine

## 2017-06-24 VITALS — BP 110/72 | HR 82 | Temp 98.0°F | Resp 16 | Ht 69.0 in | Wt 226.0 lb

## 2017-06-24 DIAGNOSIS — Z0000 Encounter for general adult medical examination without abnormal findings: Secondary | ICD-10-CM

## 2017-06-24 DIAGNOSIS — Z125 Encounter for screening for malignant neoplasm of prostate: Secondary | ICD-10-CM

## 2017-06-24 NOTE — Progress Notes (Signed)
Patient: Gregory Avila, Male    DOB: 15-Feb-1961, 57 y.o.   MRN: [id number] Visit Date: 06/24/2017  Today's Provider: Megan Mans, MD   Chief Complaint  Patient presents with  . Annual Exam   Subjective:    Annual physical exam Gregory Avila is a 57 y.o. male who presents today for health maintenance and complete physical. He feels well. He reports he is not exercising. He reports he is sleeping well.  ----------------------------------------------------------------- Colonoscopy- 03/11/12, Dr. Jarold Motto. Normal   Review of Systems  Constitutional: Negative.   HENT: Negative.   Eyes: Negative.   Respiratory: Negative.   Cardiovascular: Negative.   Gastrointestinal: Negative.   Endocrine: Negative.   Genitourinary: Negative.   Musculoskeletal: Positive for arthralgias.  Allergic/Immunologic: Negative.   Neurological: Negative.   Hematological: Negative.   Psychiatric/Behavioral: Negative.     Social History      He  reports that  has never smoked. he has never used smokeless tobacco. He reports that he does not drink alcohol or use drugs.       Social History   Socioeconomic History  . Marital status: Married    Spouse name: None  . Number of children: 4  . Years of education: None  . Highest education level: None  Social Needs  . Financial resource strain: None  . Food insecurity - worry: None  . Food insecurity - inability: None  . Transportation needs - medical: None  . Transportation needs - non-medical: None  Occupational History  . Occupation: Research scientist (medical)  Tobacco Use  . Smoking status: Never Smoker  . Smokeless tobacco: Never Used  Substance and Sexual Activity  . Alcohol use: No  . Drug use: No  . Sexual activity: None  Other Topics Concern  . None  Social History Narrative   Occ [employer]     Past Medical History:  Diagnosis Date  . Esophageal reflux   . Hiatal hernia   . Peptic stricture of esophagus   . Schatzki's ring       Patient Active Problem List   Diagnosis Date Noted  . Chest tightness 03/15/2013  . GERD (gastroesophageal reflux disease) 07/30/2011  . Gastric regurgitation 07/30/2011  . Aspiration of gastric contents 07/30/2011    Past Surgical History:  Procedure Laterality Date  . APPENDECTOMY    . WRIST SURGERY     right x3, plate    Family History        Family Status  Relation Name Status  . Mother  Alive  . Father  Deceased  . Brother  Alive  . PGF  (Not Specified)  . Neg Hx  (Not Specified)        His family history includes Breast cancer in his mother; Other in his father; Pancreatic cancer in his paternal grandfather. There is no history of Colon cancer, Esophageal cancer, Rectal cancer, or Stomach cancer.      No Known Allergies   Current Outpatient Medications:  .  pantoprazole (PROTONIX) 20 MG tablet, Take 1 tablet (20 mg total) by mouth daily. Take 1 tablet daily not 2 -due to insurance coverage, Disp: 90 tablet, Rfl: 2 .  ranitidine (ZANTAC) 150 MG capsule, Take 150 mg by mouth 2 (two) times daily., Disp: , Rfl:    Patient Care Team: Maple Hudson., MD as PCP - General (Unknown Physician Specialty)      Objective:   Vitals: BP 110/72 (BP Location: Right Arm, Patient Position:  Sitting, Cuff Size: Large)   Pulse 82   Temp 98 F (36.7 C) (Oral)   Resp 16   Ht 5\' 9"  (1.753 m)   Wt 226 lb (102.5 kg)   BMI 33.37 kg/m    Vitals:   06/24/17 1416  BP: 110/72  Pulse: 82  Resp: 16  Temp: 98 F (36.7 C)  TempSrc: Oral  Weight: 226 lb (102.5 kg)  Height: 5\' 9"  (1.753 m)     Physical Exam  Constitutional: He is oriented to person, place, and time. He appears well-developed and well-nourished.  HENT:  Head: Normocephalic and atraumatic.  Right Ear: External ear normal.  Left Ear: External ear normal.  Nose: Nose normal.  Mouth/Throat: Oropharynx is clear and moist.  Eyes: Conjunctivae are normal. No scleral icterus.  Neck: No thyromegaly  present.  Cardiovascular: Normal rate, regular rhythm, normal heart sounds and intact distal pulses.  Pulmonary/Chest: Effort normal and breath sounds normal.  Abdominal: Soft.  Genitourinary: Rectum normal, prostate normal and penis normal.  Musculoskeletal: Normal range of motion.  Lymphadenopathy:    He has no cervical adenopathy.  Neurological: He is alert and oriented to person, place, and time.  Skin: Skin is warm and dry.  Psychiatric: He has a normal mood and affect. His behavior is normal. Judgment and thought content normal.     Depression Screen PHQ 2/9 Scores 06/24/2017  PHQ - 2 Score 0  PHQ- 9 Score 0      Assessment & Plan:     Routine Health Maintenance and Physical Exam  Exercise Activities and Dietary recommendations Goals    None      Immunization History  Administered Date(s) Administered  . Tdap 08/02/2013    Health Maintenance  Topic Date Due  . HIV Screening  08/26/1976  . INFLUENZA VACCINE  12/11/2017 (Originally 12/11/2016)  . COLONOSCOPY  03/11/2022  . TETANUS/TDAP  08/03/2023  . Hepatitis C Screening  Completed     Discussed health benefits of physical activity, and encouraged him to engage in regular exercise appropriate for his age and condition.  RTC 1 year.   --------------------------------------------------------------------  I have done the exam and reviewed the above chart and it is accurate to the best of my knowledge. [employer]  technology has been used in this note in any air is in the dictation or transcription are unintentional.   Megan Mansichard Gilbert Jr, MD  [facility]

## 2017-06-24 NOTE — Progress Notes (Deleted)
       Patient: Gregory LeatherwoodScott H Avila Male    DOB: 1960/10/01   57 y.o.   MRN: [id number] Visit Date: 06/24/2017  Today's Provider: Megan Mansichard Gilbert Jr, MD   Chief Complaint  Patient presents with  . Annual Exam   Subjective:    HPI     No Known Allergies   Current Outpatient Medications:  .  pantoprazole (PROTONIX) 20 MG tablet, Take 1 tablet (20 mg total) by mouth daily. Take 1 tablet daily not 2 -due to insurance coverage, Disp: 90 tablet, Rfl: 2 .  pantoprazole (PROTONIX) 20 MG tablet, TAKE 1 TABLET BY MOUTH EVERY DAY, Disp: 90 tablet, Rfl: 0 .  ranitidine (ZANTAC) 150 MG capsule, Take 150 mg by mouth 2 (two) times daily., Disp: , Rfl:   Review of Systems  Constitutional: Negative.   HENT: Negative.   Eyes: Negative.   Respiratory: Negative.   Cardiovascular: Negative.   Gastrointestinal: Negative.   Endocrine: Negative.   Genitourinary: Negative.   Musculoskeletal: Negative.   Skin: Negative.   Allergic/Immunologic: Negative.   Neurological: Negative.   Hematological: Negative.   Psychiatric/Behavioral: Negative.     Social History   Tobacco Use  . Smoking status: Never Smoker  . Smokeless tobacco: Never Used  Substance Use Topics  . Alcohol use: No   Objective:   There were no vitals taken for this visit. There were no vitals filed for this visit.   Physical Exam      Assessment & Plan:           Megan Mansichard Gilbert Jr, MD  [facility]

## 2017-06-25 ENCOUNTER — Telehealth: Payer: Self-pay

## 2017-06-25 LAB — LIPID PANEL
CHOL/HDL RATIO: 4.6 ratio (ref 0.0–5.0)
Cholesterol, Total: 171 mg/dL (ref 100–199)
HDL: 37 mg/dL — ABNORMAL LOW (ref >39)
LDL Calculated: 94 mg/dL (ref 0–99)
Triglycerides: 202 mg/dL — ABNORMAL HIGH (ref 0–149)
VLDL Cholesterol Cal: 40 mg/dL (ref 5–40)

## 2017-06-25 LAB — PSA: Prostate Specific Ag, Serum: 1.6 ng/mL (ref 0.0–4.0)

## 2017-06-25 LAB — CBC WITH DIFFERENTIAL/PLATELET
BASOS: 0 %
Basophils Absolute: 0 10*3/uL (ref 0.0–0.2)
EOS (ABSOLUTE): 0.3 10*3/uL (ref 0.0–0.4)
EOS: 4 %
HEMATOCRIT: 45 % (ref 37.5–51.0)
Hemoglobin: 15.4 g/dL (ref 13.0–17.7)
Immature Grans (Abs): 0 10*3/uL (ref 0.0–0.1)
Immature Granulocytes: 0 %
LYMPHS ABS: 1.4 10*3/uL (ref 0.7–3.1)
Lymphs: 21 %
MCH: 29 pg (ref 26.6–33.0)
MCHC: 34.2 g/dL (ref 31.5–35.7)
MCV: 85 fL (ref 79–97)
MONOS ABS: 0.5 10*3/uL (ref 0.1–0.9)
Monocytes: 7 %
NEUTROS PCT: 68 %
Neutrophils Absolute: 4.6 10*3/uL (ref 1.4–7.0)
PLATELETS: 273 10*3/uL (ref 150–379)
RBC: 5.31 x10E6/uL (ref 4.14–5.80)
RDW: 13.9 % (ref 12.3–15.4)
WBC: 6.9 10*3/uL (ref 3.4–10.8)

## 2017-06-25 LAB — COMPREHENSIVE METABOLIC PANEL
ALT: 17 IU/L (ref 0–44)
AST: 19 IU/L (ref 0–40)
Albumin/Globulin Ratio: 1.6 (ref 1.2–2.2)
Albumin: 4.3 g/dL (ref 3.5–5.5)
Alkaline Phosphatase: 90 IU/L (ref 39–117)
BUN/Creatinine Ratio: 7 — ABNORMAL LOW (ref 9–20)
BUN: 9 mg/dL (ref 6–24)
Bilirubin Total: 0.3 mg/dL (ref 0.0–1.2)
CALCIUM: 9.4 mg/dL (ref 8.7–10.2)
CO2: 23 mmol/L (ref 20–29)
Chloride: 104 mmol/L (ref 96–106)
Creatinine, Ser: 1.21 mg/dL (ref 0.76–1.27)
GFR calc Af Amer: 77 mL/min/{1.73_m2} (ref >59)
GFR, EST NON AFRICAN AMERICAN: 67 mL/min/{1.73_m2} (ref >59)
GLUCOSE: 85 mg/dL (ref 65–99)
Globulin, Total: 2.7 g/dL (ref 1.5–4.5)
POTASSIUM: 4.2 mmol/L (ref 3.5–5.2)
Sodium: 141 mmol/L (ref 134–144)
TOTAL PROTEIN: 7 g/dL (ref 6.0–8.5)

## 2017-06-25 LAB — TSH: TSH: 2.94 u[IU]/mL (ref 0.450–4.500)

## 2017-06-25 NOTE — Telephone Encounter (Signed)
Patient advised as below.  

## 2017-06-25 NOTE — Telephone Encounter (Signed)
-----   Message from Maple Hudsonichard L Gilbert Jr., MD sent at 06/25/2017  8:33 AM EST ----- Labs OK.

## 2017-06-30 LAB — POCT URINALYSIS DIPSTICK
Bilirubin, UA: NEGATIVE
Blood, UA: NEGATIVE
Glucose, UA: NEGATIVE
KETONES UA: NEGATIVE
Leukocytes, UA: NEGATIVE
NITRITE UA: NEGATIVE
PH UA: 6 (ref 5.0–8.0)
PROTEIN UA: NEGATIVE
Spec Grav, UA: 1.015 (ref 1.010–1.025)
UROBILINOGEN UA: 0.2 U/dL

## 2017-08-06 ENCOUNTER — Other Ambulatory Visit: Payer: Self-pay | Admitting: Family Medicine

## 2018-08-19 ENCOUNTER — Other Ambulatory Visit: Payer: Self-pay | Admitting: Family Medicine

## 2018-11-16 ENCOUNTER — Other Ambulatory Visit: Payer: Self-pay | Admitting: Family Medicine

## 2018-11-16 NOTE — Telephone Encounter (Signed)
Please advise refill? 

## 2018-12-24 ENCOUNTER — Telehealth: Payer: Self-pay | Admitting: Family Medicine

## 2018-12-24 MED ORDER — PANTOPRAZOLE SODIUM 20 MG PO TBEC: 20.0000 mg | DELAYED_RELEASE_TABLET | Freq: Every day | ORAL | 2 refills | Status: DC

## 2018-12-24 NOTE — Telephone Encounter (Signed)
Pt needs refill  Pantoprazole 20mg   [facility]

## 2018-12-29 ENCOUNTER — Telehealth: Payer: Self-pay | Admitting: Family Medicine

## 2018-12-29 NOTE — Telephone Encounter (Signed)
Pt's wife called saying she was told to give [place] a number for a PA on her Husbands Pantoprazole 20 mg for a 90 day supply  [facility] # [phone]  Thanks Con Memos

## 2018-12-30 NOTE — Telephone Encounter (Signed)
PA done and approved.  Pharmacy notified.

## 2019-09-08 ENCOUNTER — Encounter: Payer: Self-pay | Admitting: Family Medicine

## 2019-09-08 ENCOUNTER — Other Ambulatory Visit: Payer: Self-pay

## 2019-09-08 ENCOUNTER — Ambulatory Visit (INDEPENDENT_AMBULATORY_CARE_PROVIDER_SITE_OTHER): Payer: 59 | Admitting: Family Medicine

## 2019-09-08 VITALS — BP 107/75 | HR 76 | Temp 96.8°F | Ht 69.0 in | Wt 226.0 lb

## 2019-09-08 DIAGNOSIS — Z0000 Encounter for general adult medical examination without abnormal findings: Secondary | ICD-10-CM

## 2019-09-08 DIAGNOSIS — Z125 Encounter for screening for malignant neoplasm of prostate: Secondary | ICD-10-CM | POA: Diagnosis not present

## 2019-09-08 DIAGNOSIS — Z23 Encounter for immunization: Secondary | ICD-10-CM | POA: Diagnosis not present

## 2019-09-08 DIAGNOSIS — K2100 Gastro-esophageal reflux disease with esophagitis, without bleeding: Secondary | ICD-10-CM | POA: Diagnosis not present

## 2019-09-08 DIAGNOSIS — H9313 Tinnitus, bilateral: Secondary | ICD-10-CM | POA: Diagnosis not present

## 2019-09-08 LAB — POCT URINALYSIS DIPSTICK
Bilirubin, UA: NEGATIVE
Blood, UA: NEGATIVE
Glucose, UA: NEGATIVE
Ketones, UA: NEGATIVE
Leukocytes, UA: NEGATIVE
Nitrite, UA: NEGATIVE
Protein, UA: NEGATIVE
Spec Grav, UA: 1.025 (ref 1.010–1.025)
Urobilinogen, UA: 0.2 E.U./dL
pH, UA: 6 (ref 5.0–8.0)

## 2019-09-08 NOTE — Progress Notes (Signed)
Complete physical exam   Patient: Gregory Avila   DOB: [birth year]   59 y.o. Male  MRN: [id number] Visit Date: 09/08/2019  Today's healthcare provider: Wilhemena Durie, MD   Chief Complaint  Patient presents with  . Annual Exam   Subjective    Gregory Avila is a 59 y.o. male who presents today for a complete physical exam.  He reports consuming a general diet. Gym/ health club routine includes Tennis. He generally feels well. He reports sleeping well. He does not have additional problems to discuss today.  His whole family had Covid over the winter but he states he did not get ill.  HPI  He has had tinnitus with for the past 3 years without any dizziness.  He states his hearing is okay. Last colonoscopy- 03/09/2012 Past Medical History:  Diagnosis Date  . Esophageal reflux   . Hiatal hernia   . Peptic stricture of esophagus   . Schatzki's ring    Past Surgical History:  Procedure Laterality Date  . APPENDECTOMY    . WRIST SURGERY     right x3, plate   Social History   Socioeconomic History  . Marital status: Married    Spouse name: Not on file  . Number of children: 4  . Years of education: Not on file  . Highest education level: Not on file  Occupational History  . Occupation: Optometrist  Tobacco Use  . Smoking status: Never Smoker  . Smokeless tobacco: Never Used  Substance and Sexual Activity  . Alcohol use: No  . Drug use: No  . Sexual activity: Not on file  Other Topics Concern  . Not on file  Social History Narrative   Occ Caffeine    Social Determinants of Health   Financial Resource Strain:   . Difficulty of Paying Living Expenses:   Food Insecurity:   . Worried About Charity fundraiser in the Last Year:   . Arboriculturist in the Last Year:   Transportation Needs:   . Film/video editor (Medical):   Marland Kitchen Lack of Transportation (Non-Medical):   Physical Activity:   . Days of Exercise per Week:   . Minutes of Exercise per Session:    Stress:   . Feeling of Stress :   Social Connections:   . Frequency of Communication with Friends and Family:   . Frequency of Social Gatherings with Friends and Family:   . Attends Religious Services:   . Active Member of Clubs or Organizations:   . Attends Archivist Meetings:   Marland Kitchen Marital Status:   Intimate Partner Violence:   . Fear of Current or Ex-Partner:   . Emotionally Abused:   Marland Kitchen Physically Abused:   . Sexually Abused:    Family Status  Relation Name Status  . Mother  Alive  . Father  Deceased  . Brother  Alive  . PGF  (Not Specified)  . Neg Hx  (Not Specified)   Family History  Problem Relation Age of Onset  . Breast cancer Mother   . Other Father        small cell carcinoma of his lung  . Pancreatic cancer Paternal Grandfather   . Colon cancer Neg Hx   . Esophageal cancer Neg Hx   . Rectal cancer Neg Hx   . Stomach cancer Neg Hx    No Known Allergies  Patient Care Team: Jerrol Banana., MD as PCP - General (  Unknown Physician Specialty)   Medications: Outpatient Medications Prior to Visit  Medication Sig  . pantoprazole (PROTONIX) 20 MG tablet TAKE 1 TABLET BY MOUTH EVERY DAY  . pantoprazole (PROTONIX) 20 MG tablet Take 1 tablet (20 mg total) by mouth daily. Take 1 tablet daily not 2 -due to insurance coverage  . ranitidine (ZANTAC) 150 MG capsule Take 150 mg by mouth 2 (two) times daily.   No facility-administered medications prior to visit.    Review of Systems  Constitutional: Negative.   HENT: Positive for tinnitus.   Eyes: Negative.   Respiratory: Negative.   Cardiovascular: Negative.   Gastrointestinal: Negative.   Endocrine: Negative.   Genitourinary: Negative.   Musculoskeletal: Negative.   Skin: Negative.   Allergic/Immunologic: Negative.   Neurological: Negative.   Hematological: Negative.   Psychiatric/Behavioral: Negative.        Objective    BP 107/75 (BP Location: Right Arm, Patient Position: Sitting, Cuff  Size: Large)   Pulse 76   Temp (!) 96.8 F (36 C) (Temporal)   Ht 5\' 9"  (1.753 m)   Wt 226 lb (102.5 kg)   SpO2 99%   BMI 33.37 kg/m  Wt Readings from Last 3 Encounters:  09/08/19 226 lb (102.5 kg)  06/24/17 226 lb (102.5 kg)  12/28/15 223 lb (101.2 kg)      Physical Exam Vitals reviewed.  Constitutional:      Appearance: He is obese.  HENT:     Head: Normocephalic and atraumatic.     Right Ear: External ear normal.     Left Ear: External ear normal.     Nose: Nose normal.     Mouth/Throat:     Pharynx: Oropharynx is clear.  Eyes:     General: No scleral icterus.    Conjunctiva/sclera: Conjunctivae normal.  Cardiovascular:     Rate and Rhythm: Normal rate and regular rhythm.     Pulses: Normal pulses.     Heart sounds: Normal heart sounds.  Pulmonary:     Effort: Pulmonary effort is normal.     Breath sounds: Normal breath sounds.  Abdominal:     Palpations: Abdomen is soft.  Genitourinary:    Penis: Normal.      Testes: Normal.     Prostate: Normal.     Rectum: Normal.  Musculoskeletal:     Right lower leg: No edema.     Left lower leg: No edema.  Lymphadenopathy:     Cervical: No cervical adenopathy.  Skin:    General: Skin is warm and dry.  Neurological:     General: No focal deficit present.     Mental Status: He is alert and oriented to person, place, and time.  Psychiatric:        Mood and Affect: Mood normal.        Behavior: Behavior normal.        Thought Content: Thought content normal.        Judgment: Judgment normal.       Depression Screen  PHQ 2/9 Scores 06/24/2017  PHQ - 2 Score 0  PHQ- 9 Score 0    No results found for any visits on 09/08/19.  Assessment & Plan    Routine Health Maintenance and Physical Exam  Exercise Activities and Dietary recommendations Goals   None     Immunization History  Administered Date(s) Administered  . Tdap 08/02/2013    Health Maintenance  Topic Date Due  . HIV Screening  Never done   .  COVID-19 Vaccine (1) Never done  . INFLUENZA VACCINE  12/12/2019  . COLONOSCOPY  03/11/2022  . TETANUS/TDAP  08/03/2023  . Hepatitis C Screening  Completed    Discussed health benefits of physical activity, and encouraged him to engage in regular exercise appropriate for his age and condition. 1. Annual physical exam Follow-up 1 year - CBC with Differential/Platelet - Comprehensive metabolic panel - TSH - Lipid panel - PSA - POCT urinalysis dipstick - SAR CoV2 Serology (COVID 19)AB(IGG)IA  2. Gastroesophageal reflux disease with esophagitis, unspecified whether hemorrhage Patient states he cannot daily do without daily PPI. - CBC with Differential/Platelet - Comprehensive metabolic panel - TSH - Lipid panel - PSA - POCT urinalysis dipstick - SAR CoV2 Serology (COVID 19)AB(IGG)IA  3.  Covid exposure Normal blood pressure.  Due to exposure to Covid from family will obtain antibodies.  This was several months ago. - CBC with Differential/Platelet - Comprehensive metabolic panel - TSH - Lipid panel - PSA - POCT urinalysis dipstick - SAR CoV2 Serology (COVID 19)AB(IGG)IA  4. Need for shingles vaccine  - Varicella-zoster vaccine IM (Shingrix)  5. Screening for prostate cancer  - PSA  6. Tinnitus aurium, bilateral Probable presbycusis.  This was discussed with patient    No follow-ups on file.        Justice Milliron Wendelyn Breslow, MD  [facility] [phone] (phone) [phone] (fax)  [facility]

## 2019-09-09 LAB — COMPREHENSIVE METABOLIC PANEL
ALT: 19 IU/L (ref 0–44)
AST: 19 IU/L (ref 0–40)
Albumin/Globulin Ratio: 1.9 (ref 1.2–2.2)
Albumin: 4.6 g/dL (ref 3.8–4.9)
Alkaline Phosphatase: 94 IU/L (ref 39–117)
BUN/Creatinine Ratio: 9 (ref 9–20)
BUN: 11 mg/dL (ref 6–24)
Bilirubin Total: 0.5 mg/dL (ref 0.0–1.2)
CO2: 26 mmol/L (ref 20–29)
Calcium: 9.7 mg/dL (ref 8.7–10.2)
Chloride: 101 mmol/L (ref 96–106)
Creatinine, Ser: 1.23 mg/dL (ref 0.76–1.27)
GFR calc Af Amer: 74 mL/min/{1.73_m2} (ref >59)
GFR calc non Af Amer: 64 mL/min/{1.73_m2} (ref >59)
Globulin, Total: 2.4 g/dL (ref 1.5–4.5)
Glucose: 78 mg/dL (ref 65–99)
Potassium: 4.6 mmol/L (ref 3.5–5.2)
Sodium: 137 mmol/L (ref 134–144)
Total Protein: 7 g/dL (ref 6.0–8.5)

## 2019-09-09 LAB — CBC WITH DIFFERENTIAL/PLATELET
Basophils Absolute: 0 10*3/uL (ref 0.0–0.2)
Basos: 1 %
EOS (ABSOLUTE): 0.2 10*3/uL (ref 0.0–0.4)
Eos: 2 %
Hematocrit: 46.5 % (ref 37.5–51.0)
Hemoglobin: 16.3 g/dL (ref 13.0–17.7)
Immature Grans (Abs): 0 10*3/uL (ref 0.0–0.1)
Immature Granulocytes: 0 %
Lymphocytes Absolute: 1.6 10*3/uL (ref 0.7–3.1)
Lymphs: 19 %
MCH: 29.5 pg (ref 26.6–33.0)
MCHC: 35.1 g/dL (ref 31.5–35.7)
MCV: 84 fL (ref 79–97)
Monocytes Absolute: 0.8 10*3/uL (ref 0.1–0.9)
Monocytes: 9 %
Neutrophils Absolute: 5.9 10*3/uL (ref 1.4–7.0)
Neutrophils: 69 %
Platelets: 268 10*3/uL (ref 150–450)
RBC: 5.52 x10E6/uL (ref 4.14–5.80)
RDW: 12.9 % (ref 11.6–15.4)
WBC: 8.4 10*3/uL (ref 3.4–10.8)

## 2019-09-09 LAB — LIPID PANEL
Chol/HDL Ratio: 4.4 ratio (ref 0.0–5.0)
Cholesterol, Total: 179 mg/dL (ref 100–199)
HDL: 41 mg/dL (ref >39)
LDL Chol Calc (NIH): 109 mg/dL — ABNORMAL HIGH (ref 0–99)
Triglycerides: 164 mg/dL — ABNORMAL HIGH (ref 0–149)
VLDL Cholesterol Cal: 29 mg/dL (ref 5–40)

## 2019-09-09 LAB — SAR COV2 SEROLOGY (COVID19)AB(IGG),IA: DiaSorin SARS-CoV-2 Ab, IgG: POSITIVE

## 2019-09-09 LAB — TSH: TSH: 2.99 u[IU]/mL (ref 0.450–4.500)

## 2019-09-09 LAB — PSA: Prostate Specific Ag, Serum: 1.4 ng/mL (ref 0.0–4.0)

## 2020-01-20 ENCOUNTER — Other Ambulatory Visit: Payer: Self-pay | Admitting: Family Medicine

## 2020-01-20 NOTE — Telephone Encounter (Signed)
   Notes to clinic Has two Rx for  Protonix, please assess.

## 2020-03-09 ENCOUNTER — Ambulatory Visit: Payer: Self-pay | Admitting: Family Medicine

## 2020-03-13 ENCOUNTER — Ambulatory Visit (INDEPENDENT_AMBULATORY_CARE_PROVIDER_SITE_OTHER): Payer: 59 | Admitting: Family Medicine

## 2020-03-13 ENCOUNTER — Other Ambulatory Visit: Payer: Self-pay

## 2020-03-13 DIAGNOSIS — Z23 Encounter for immunization: Secondary | ICD-10-CM

## 2020-03-17 NOTE — Progress Notes (Signed)
Vaccine only

## 2020-05-09 ENCOUNTER — Encounter: Payer: Self-pay | Admitting: Family Medicine

## 2020-05-30 NOTE — Telephone Encounter (Signed)
Offer patient virtual visit with me or with someone else.  Thank you.

## 2020-09-05 NOTE — Progress Notes (Signed)
Complete physical exam   Patient: Gregory Avila   DOB: [birth year]   60 y.o. Male  MRN: [id number] Visit Date: 09/06/2020  Today's healthcare provider: Megan Mans, MD   Chief Complaint  Patient presents with  . Annual Exam   Subjective    Gregory Avila is a 60 y.o. male who presents today for a complete physical exam.  He reports consuming a general diet. Home exercise routine includes golf and tennis. He generally feels well. He reports sleeping well. He does not have additional problems to discuss today.  He is married and he and his wife are very stressed out as there are 65 year old son got his third DUI last night and he does not think he has a problem.  He has been in rehab 3 times and does not follow through.  He is worried very much about this. Patient was unvaccinated and had COVID in January for the second time that he thinks he had COVID. He is considering getting COVID-vaccine in the next few months.  He had a brother that died of COVID. HPI    Past Medical History:  Diagnosis Date  . Esophageal reflux   . Hiatal hernia   . Peptic stricture of esophagus   . Schatzki's ring    Past Surgical History:  Procedure Laterality Date  . APPENDECTOMY    . WRIST SURGERY     right x3, plate   Social History   Socioeconomic History  . Marital status: Married    Spouse name: Not on file  . Number of children: 4  . Years of education: Not on file  . Highest education level: Not on file  Occupational History  . Occupation: Research scientist (medical)  Tobacco Use  . Smoking status: Never Smoker  . Smokeless tobacco: Never Used  Vaping Use  . Vaping Use: Never used  Substance and Sexual Activity  . Alcohol use: No  . Drug use: No  . Sexual activity: Not on file  Other Topics Concern  . Not on file  Social History Narrative   Occ Caffeine    Social Determinants of Health   Financial Resource Strain: Not on file  Food Insecurity: Not on file  Transportation  Needs: Not on file  Physical Activity: Not on file  Stress: Not on file  Social Connections: Not on file  Intimate Partner Violence: Not on file   Family Status  Relation Name Status  . Mother  Alive  . Father  Deceased  . Brother  Alive  . PGF  (Not Specified)  . Neg Hx  (Not Specified)   Family History  Problem Relation Age of Onset  . Breast cancer Mother   . Other Father        small cell carcinoma of his lung  . Pancreatic cancer Paternal Grandfather   . Colon cancer Neg Hx   . Esophageal cancer Neg Hx   . Rectal cancer Neg Hx   . Stomach cancer Neg Hx    No Known Allergies  Patient Care Team: Maple Hudson., MD as PCP - General (Unknown Physician Specialty)   Medications: Outpatient Medications Prior to Visit  Medication Sig  . pantoprazole (PROTONIX) 20 MG tablet TAKE 1 TABLET BY MOUTH EVERY DAY  . pantoprazole (PROTONIX) 20 MG tablet TAKE 1 TABLET (20 MG TOTAL) BY MOUTH DAILY. TAKE 1 TABLET DAILY NOT 2 -DUE TO INSURANCE COVERAGE  . ranitidine (ZANTAC) 150 MG capsule Take 150 mg by  mouth 2 (two) times daily.   No facility-administered medications prior to visit.    Review of Systems  Constitutional: Negative.   HENT: Negative.   Eyes: Negative.   Respiratory: Negative.   Cardiovascular: Negative.   Gastrointestinal: Negative.   Endocrine: Negative.   Genitourinary: Negative.   Musculoskeletal: Negative.   Skin: Negative.   Allergic/Immunologic: Negative.   Neurological: Negative.   Hematological: Negative.   Psychiatric/Behavioral: Negative.        Objective    BP 126/89 (BP Location: Left Arm, Patient Position: Sitting, Cuff Size: Normal)   Pulse 79   Temp 98.1 F (36.7 C) (Oral)   Ht 5\' 9"  (1.753 m)   Wt 225 lb 3.2 oz (102.2 kg)   SpO2 99%   BMI 33.26 kg/m  BP Readings from Last 3 Encounters:  09/06/20 126/89  09/08/19 107/75  06/24/17 110/72   Wt Readings from Last 3 Encounters:  09/06/20 225 lb 3.2 oz (102.2 kg)  09/08/19  226 lb (102.5 kg)  06/24/17 226 lb (102.5 kg)      Physical Exam Vitals reviewed.  Constitutional:      Appearance: He is obese.  HENT:     Head: Normocephalic and atraumatic.     Right Ear: External ear normal.     Left Ear: External ear normal.     Nose: Nose normal.     Mouth/Throat:     Pharynx: Oropharynx is clear.  Eyes:     General: No scleral icterus.    Conjunctiva/sclera: Conjunctivae normal.  Cardiovascular:     Rate and Rhythm: Normal rate and regular rhythm.     Pulses: Normal pulses.     Heart sounds: Normal heart sounds.  Pulmonary:     Effort: Pulmonary effort is normal.     Breath sounds: Normal breath sounds.  Abdominal:     Palpations: Abdomen is soft.  Genitourinary:    Penis: Normal.      Testes: Normal.  Musculoskeletal:     Right lower leg: No edema.     Left lower leg: No edema.  Lymphadenopathy:     Cervical: No cervical adenopathy.  Skin:    General: Skin is warm and dry.  Neurological:     General: No focal deficit present.     Mental Status: He is alert and oriented to person, place, and time.  Psychiatric:        Mood and Affect: Mood normal.        Behavior: Behavior normal.        Thought Content: Thought content normal.        Judgment: Judgment normal.       Last depression screening scores PHQ 2/9 Scores 09/06/2020 09/08/2019 06/24/2017  PHQ - 2 Score 0 0 0  PHQ- 9 Score 0 0 0   Last fall risk screening Fall Risk  09/06/2020  Falls in the past year? 0  Number falls in past yr: 0  Injury with Fall? 0  Risk for fall due to : No Fall Risks  Follow up Falls evaluation completed   Last Audit-C alcohol use screening Alcohol Use Disorder Test (AUDIT) 09/06/2020  1. How often do you have a drink containing alcohol? 1  2. How many drinks containing alcohol do you have on a typical day when you are drinking? 0  3. How often do you have six or more drinks on one occasion? 0  AUDIT-C Score 1   A score of 3 or more in women,  and 4  or more in men indicates increased risk for alcohol abuse, EXCEPT if all of the points are from question 1   No results found for any visits on 09/06/20.  Assessment & Plan    Routine Health Maintenance and Physical Exam  Exercise Activities and Dietary recommendations Goals   None     Immunization History  Administered Date(s) Administered  . Tdap 08/02/2013  . Zoster Recombinat (Shingrix) 09/08/2019, 03/13/2020    Health Maintenance  Topic Date Due  . COVID-19 Vaccine (1) Never done  . HIV Screening  Never done  . INFLUENZA VACCINE  12/11/2020  . COLONOSCOPY (Pts 45-36yrs Insurance coverage will need to be confirmed)  03/11/2022  . TETANUS/TDAP  08/03/2023  . Hepatitis C Screening  Completed  . HPV VACCINES  Aged Out    Discussed health benefits of physical activity, and encouraged him to engage in regular exercise appropriate for his age and condition.  1. Annual physical exam Recommend COVID-vaccine this summer. - HIV Antibody (routine testing w rflx) - TSH - Lipid panel - Comprehensive metabolic panel - CBC with Differential/Platelet  2. Gastroesophageal reflux disease, unspecified whether esophagitis present Patient becomes symptomatic if he does not take pantoprazole  3. Screening for prostate cancer  - PSA  4. Screening for blood or protein in urine  - POCT urinalysis dipstick   No follow-ups on file.     I, Megan Mans, MD, have reviewed all documentation for this visit. The documentation on 09/12/20 for the exam, diagnosis, procedures, and orders are all accurate and complete.    Quintavius Niebuhr Wendelyn Breslow, MD  [facility] [phone] (phone) [phone] (fax)  [facility]

## 2020-09-06 ENCOUNTER — Encounter: Payer: Self-pay | Admitting: Family Medicine

## 2020-09-06 ENCOUNTER — Other Ambulatory Visit: Payer: Self-pay

## 2020-09-06 ENCOUNTER — Ambulatory Visit (INDEPENDENT_AMBULATORY_CARE_PROVIDER_SITE_OTHER): Payer: 59 | Admitting: Family Medicine

## 2020-09-06 VITALS — BP 126/89 | HR 79 | Temp 98.1°F | Ht 69.0 in | Wt 225.2 lb

## 2020-09-06 DIAGNOSIS — Z0000 Encounter for general adult medical examination without abnormal findings: Secondary | ICD-10-CM | POA: Diagnosis not present

## 2020-09-06 DIAGNOSIS — K219 Gastro-esophageal reflux disease without esophagitis: Secondary | ICD-10-CM | POA: Diagnosis not present

## 2020-09-06 DIAGNOSIS — Z1389 Encounter for screening for other disorder: Secondary | ICD-10-CM | POA: Diagnosis not present

## 2020-09-06 DIAGNOSIS — Z125 Encounter for screening for malignant neoplasm of prostate: Secondary | ICD-10-CM | POA: Diagnosis not present

## 2020-09-06 LAB — POCT URINALYSIS DIPSTICK
Bilirubin, UA: NEGATIVE
Blood, UA: NEGATIVE
Glucose, UA: NEGATIVE
Ketones, UA: NEGATIVE
Leukocytes, UA: NEGATIVE
Nitrite, UA: NEGATIVE
Protein, UA: NEGATIVE
Spec Grav, UA: 1.015 (ref 1.010–1.025)
Urobilinogen, UA: 0.2 E.U./dL
pH, UA: 5 (ref 5.0–8.0)

## 2020-09-08 LAB — CBC WITH DIFFERENTIAL/PLATELET
Basophils Absolute: 0.1 10*3/uL (ref 0.0–0.2)
Basos: 1 %
EOS (ABSOLUTE): 0.3 10*3/uL (ref 0.0–0.4)
Eos: 5 %
Hematocrit: 46.7 % (ref 37.5–51.0)
Hemoglobin: 16.4 g/dL (ref 13.0–17.7)
Immature Grans (Abs): 0 10*3/uL (ref 0.0–0.1)
Immature Granulocytes: 0 %
Lymphocytes Absolute: 1.6 10*3/uL (ref 0.7–3.1)
Lymphs: 23 %
MCH: 29.5 pg (ref 26.6–33.0)
MCHC: 35.1 g/dL (ref 31.5–35.7)
MCV: 84 fL (ref 79–97)
Monocytes Absolute: 0.5 10*3/uL (ref 0.1–0.9)
Monocytes: 7 %
Neutrophils Absolute: 4.3 10*3/uL (ref 1.4–7.0)
Neutrophils: 64 %
Platelets: 276 10*3/uL (ref 150–450)
RBC: 5.56 x10E6/uL (ref 4.14–5.80)
RDW: 12.6 % (ref 11.6–15.4)
WBC: 6.7 10*3/uL (ref 3.4–10.8)

## 2020-09-08 LAB — COMPREHENSIVE METABOLIC PANEL
ALT: 20 IU/L (ref 0–44)
AST: 17 IU/L (ref 0–40)
Albumin/Globulin Ratio: 1.9 (ref 1.2–2.2)
Albumin: 4.6 g/dL (ref 3.8–4.9)
Alkaline Phosphatase: 97 IU/L (ref 44–121)
BUN/Creatinine Ratio: 12 (ref 9–20)
BUN: 14 mg/dL (ref 6–24)
Bilirubin Total: 0.3 mg/dL (ref 0.0–1.2)
CO2: 24 mmol/L (ref 20–29)
Calcium: 9.3 mg/dL (ref 8.7–10.2)
Chloride: 104 mmol/L (ref 96–106)
Creatinine, Ser: 1.18 mg/dL (ref 0.76–1.27)
Globulin, Total: 2.4 g/dL (ref 1.5–4.5)
Glucose: 99 mg/dL (ref 65–99)
Potassium: 4.6 mmol/L (ref 3.5–5.2)
Sodium: 141 mmol/L (ref 134–144)
Total Protein: 7 g/dL (ref 6.0–8.5)
eGFR: 71 mL/min/{1.73_m2} (ref >59)

## 2020-09-08 LAB — LIPID PANEL
Chol/HDL Ratio: 4.1 ratio (ref 0.0–5.0)
Cholesterol, Total: 153 mg/dL (ref 100–199)
HDL: 37 mg/dL — ABNORMAL LOW (ref >39)
LDL Chol Calc (NIH): 88 mg/dL (ref 0–99)
Triglycerides: 160 mg/dL — ABNORMAL HIGH (ref 0–149)
VLDL Cholesterol Cal: 28 mg/dL (ref 5–40)

## 2020-09-08 LAB — HIV ANTIBODY (ROUTINE TESTING W REFLEX): HIV Screen 4th Generation wRfx: NONREACTIVE

## 2020-09-08 LAB — PSA: Prostate Specific Ag, Serum: 1 ng/mL (ref 0.0–4.0)

## 2020-09-08 LAB — TSH: TSH: 1.86 u[IU]/mL (ref 0.450–4.500)

## 2020-09-28 ENCOUNTER — Telehealth: Payer: Self-pay | Admitting: Family Medicine

## 2020-09-28 ENCOUNTER — Other Ambulatory Visit: Payer: Self-pay

## 2020-09-28 MED ORDER — PANTOPRAZOLE SODIUM 20 MG PO TBEC: 20.0000 mg | DELAYED_RELEASE_TABLET | Freq: Every day | ORAL | 1 refills | Status: DC

## 2020-09-28 NOTE — Telephone Encounter (Signed)
Patient was seen in the office on 09/06/2020 for this physical and forgot to ask PCP for pantoprazole (PROTONIX) 20 MG tablet refill. Also, patient is requesting anxiety medication, caller states PCP is aware what patient is going through.     [id number] [place], [place] - [address] Phone:  [phone]  Fax:  [phone]

## 2021-03-18 ENCOUNTER — Other Ambulatory Visit: Payer: Self-pay | Admitting: Family Medicine

## 2021-03-18 NOTE — Telephone Encounter (Signed)
Please take off med profile the Protonix with the end date 11/16/18  This rx request is for the "old" prescription.    Outpatient and Clinic-Administered Medications  * pantoprazole (PROTONIX) 20 MG tablet  0 ordered       ranitidine (ZANTAC) 150 MG capsule 150 mg, 2 times daily        pantoprazole (PROTONIX) 20 MG tablet 20 mg, Daily 1 ordered               Requested Prescriptions  Pending Prescriptions Disp Refills   pantoprazole (PROTONIX) 20 MG tablet [Pharmacy Med Name: PANTOPRAZOLE SOD DR 20 MG TAB] 90 tablet 1    Sig: TAKE 1 TABLET (20 MG TOTAL) BY MOUTH DAILY. TAKE 1 TABLET DAILY NOT 2 -DUE TO INSURANCE COVERAGE     Gastroenterology: Proton Pump Inhibitors Passed - 03/18/2021  2:07 PM      Passed - Valid encounter within last 12 months    Recent Outpatient Visits           6 months ago Annual physical exam   [facility] Maple Hudson., MD   1 year ago Need for shingles vaccine   [facility] Maple Hudson., MD   1 year ago Annual physical exam   [facility] Maple Hudson., MD   3 years ago Annual physical exam   [facility] Maple Hudson., MD   5 years ago Annual physical exam   [facility] Maple Hudson., MD       Future Appointments             In 5 months Maple Hudson., MD [facility]

## 2021-06-01 ENCOUNTER — Ambulatory Visit: Payer: 59 | Admitting: Podiatry

## 2021-06-01 ENCOUNTER — Other Ambulatory Visit: Payer: Self-pay

## 2021-06-01 ENCOUNTER — Encounter: Payer: Self-pay | Admitting: Podiatry

## 2021-06-01 ENCOUNTER — Ambulatory Visit (INDEPENDENT_AMBULATORY_CARE_PROVIDER_SITE_OTHER): Payer: 59

## 2021-06-01 DIAGNOSIS — M7662 Achilles tendinitis, left leg: Secondary | ICD-10-CM | POA: Diagnosis not present

## 2021-06-01 DIAGNOSIS — M7661 Achilles tendinitis, right leg: Secondary | ICD-10-CM

## 2021-06-01 DIAGNOSIS — M722 Plantar fascial fibromatosis: Secondary | ICD-10-CM | POA: Diagnosis not present

## 2021-06-01 NOTE — Progress Notes (Signed)
° °  HPI: 61 y.o. male presenting today as a new patient for evaluation of bilateral foot and ankle pain.  Patient states that he has always been very active his entire life.  He has been noticing the increase in pain and stiffness directly associated with activity.  Most of his pain is in the back of the ankles.  He denies a history of injury.  He states that if he goes and plays tennis or basketball his feet and ankles are very painful the next few days "walking like a 61 year old man".  Past Medical History:  Diagnosis Date   Esophageal reflux    Hiatal hernia    Peptic stricture of esophagus    Schatzki's ring     Past Surgical History:  Procedure Laterality Date   APPENDECTOMY     WRIST SURGERY     right x3, plate    No Known Allergies   Physical Exam: General: The patient is alert and oriented x3 in no acute distress.  Dermatology: Skin is warm, dry and supple bilateral lower extremities. Negative for open lesions or macerations.  Vascular: Palpable pedal pulses bilaterally. No edema or erythema noted. Capillary refill within normal limits.  Neurological: Epicritic and protective threshold grossly intact bilaterally.   Musculoskeletal Exam: Pain on palpation noted to the posterior tubercle of the bilateral calcaneus at the insertion of the Achilles tendon consistent with retrocalcaneal bursitis. Range of motion within normal limits. Muscle strength 5/5 in all muscle groups bilateral lower extremities.  Radiographic Exam:  Posterior calcaneal spur noted to the respective calcaneus on lateral view. No fracture or dislocation noted. Normal osseous mineralization noted.     Assessment: 1. Insertional Achilles tendinitis bilateral 2.  Posterior heel spur bilateral  Plan of Care:  1. Patient was evaluated. Radiographs were reviewed today. 2.  Today we discussed conservative modalities including daily stretching exercises and orthotics.  I do believe orthotics may help cushion  the heel and provide slight lift to offload pressure from the Achilles tendon area 3.  We also discussed surgery to the posterior heel spurs and the postoperative recovery associated.  For now we are going to pursue conservative treatment 4.  Continue Aleve OTC as needed 5.  Return to clinic as needed  *Very active.  Plays golf and tennis.   Felecia Shelling, DPM [facility]  Dr. Felecia Shelling, DPM    [address] [place], [place] [address]                Office [phone]  Fax [phone]

## 2021-06-18 ENCOUNTER — Other Ambulatory Visit: Payer: 59

## 2021-06-22 ENCOUNTER — Ambulatory Visit (INDEPENDENT_AMBULATORY_CARE_PROVIDER_SITE_OTHER): Payer: 59

## 2021-06-22 ENCOUNTER — Other Ambulatory Visit: Payer: Self-pay

## 2021-06-22 DIAGNOSIS — M7661 Achilles tendinitis, right leg: Secondary | ICD-10-CM | POA: Diagnosis not present

## 2021-06-22 DIAGNOSIS — M7662 Achilles tendinitis, left leg: Secondary | ICD-10-CM | POA: Diagnosis not present

## 2021-06-22 NOTE — Progress Notes (Signed)
SITUATION Reason for Consult: Evaluation for Bilateral Custom Foot Orthoses Patient / Caregiver Report: Patient is ready for foot orthotics  OBJECTIVE DATA: Patient History / Diagnosis:    ICD-10-CM   1. Achilles tendinitis of both lower extremities  M76.61    M76.62       Current or Previous Devices: None and no history  Foot Examination: Skin presentation:   Intact Ulcers & Callousing:   None and no history Toe / Foot Deformities:  none Weight Bearing Presentation:  rectus Sensation:    Intact  ORTHOTIC RECOMMENDATION Recommended Device: 1x pair of custom functional foot orthotics  GOALS OF ORTHOSES - Reduce Pain - Prevent Foot Deformity - Prevent Progression of Further Foot Deformity - Relieve Pressure - Improve the Overall Biomechanical Function of the Foot and Lower Extremity.  ACTIONS PERFORMED Patient was casted for Foot Orthoses via crush box. Procedure was explained and patient tolerated procedure well. All questions were answered and concerns addressed.  PLAN Potential out of pocket cost was communicated to patient. Casts are to be sent to [facility] for fabrication. Patient is to be called for fitting when devices are ready.

## 2021-07-17 ENCOUNTER — Telehealth: Payer: Self-pay

## 2021-07-17 NOTE — Telephone Encounter (Signed)
Casts sent to central fabrication °

## 2021-08-10 ENCOUNTER — Telehealth: Payer: Self-pay | Admitting: Podiatry

## 2021-08-10 NOTE — Telephone Encounter (Signed)
Lvm to sch to opu ?

## 2021-08-14 ENCOUNTER — Ambulatory Visit: Payer: 59

## 2021-08-14 DIAGNOSIS — M7662 Achilles tendinitis, left leg: Secondary | ICD-10-CM

## 2021-08-14 NOTE — Progress Notes (Signed)
SITUATION: ?Reason for Visit: Fitting and Delivery of Custom Fabricated Foot Orthoses ?Patient Report: Patient reports comfort and is satisfied with device. ? ?OBJECTIVE DATA: ?Patient History / Diagnosis:   ?  ICD-10-CM   ?1. Achilles tendinitis of both lower extremities  M76.61   ? M76.62   ?  ? ? ?Provided Device:  Custom Functional Foot Orthotics ?    [employer]: [id number] ? ?GOAL OF ORTHOSIS ?- Improve gait ?- Decrease energy expenditure ?- Improve Balance ?- Provide Triplanar stability of foot complex ?- Facilitate motion ? ?ACTIONS PERFORMED ?Patient was fit with foot orthotics trimmed to shoe last. Patient tolerated fittign procedure.  ? ?Patient was provided with verbal and written instruction and demonstration regarding donning, doffing, wear, care, proper fit, function, purpose, cleaning, and use of the orthosis and in all related precautions and risks and benefits regarding the orthosis. ? ?Patient was also provided with verbal instruction regarding how to report any failures or malfunctions of the orthosis and necessary follow up care. Patient was also instructed to contact our office regarding any change in status that may affect the function of the orthosis. ? ?Patient demonstrated independence with proper donning, doffing, and fit and verbalized understanding of all instructions. ? ?PLAN: ?Patient is to follow up in one week or as necessary (PRN). All questions were answered and concerns addressed. Plan of care was discussed with and agreed upon by the patient. ? ?

## 2021-08-16 ENCOUNTER — Other Ambulatory Visit: Payer: Self-pay | Admitting: Family Medicine

## 2021-08-17 NOTE — Telephone Encounter (Signed)
Requested medication (s) are due for refill today:   Provier to review ? ?Requested medication (s) are on the active medication list:   Yes from 2 years ago ? ?Future visit scheduled:   Yes 09/10/2021  ? ? ?Last ordered: 11/16/2018 #90, 0 refills ? ?Returned because rx is from 2 years ago.  Note that pt needs appt for further refills.    ? ?Requested Prescriptions  ?Pending Prescriptions Disp Refills  ? pantoprazole (PROTONIX) 20 MG tablet [Pharmacy Med Name: PANTOPRAZOLE SOD DR 20 MG TAB] 90 tablet 1  ?  Sig: TAKE 1 TABLET (20 MG TOTAL) BY MOUTH DAILY. TAKE 1 TABLET DAILY NOT 2 -DUE TO INSURANCE COVERAGE  ?  ? Gastroenterology: Proton Pump Inhibitors Passed - 08/16/2021 11:38 AM  ?  ?  Passed - Valid encounter within last 12 months  ?  Recent Outpatient Visits   ? ?      ? 11 months ago Annual physical exam  ? [facility] Maple Hudson., MD  ? 1 year ago Need for shingles vaccine  ? [facility] Maple Hudson., MD  ? 1 year ago Annual physical exam  ? [facility] Maple Hudson., MD  ? 4 years ago Annual physical exam  ? [facility] Maple Hudson., MD  ? 5 years ago Annual physical exam  ? [facility] Maple Hudson., MD  ? ?  ?  ?Future Appointments   ? ?        ? In 3 weeks Maple Hudson., MD [facility]  ? ?  ? ?  ?  ?  ? ?

## 2021-09-10 ENCOUNTER — Encounter: Payer: Self-pay | Admitting: Family Medicine

## 2022-02-01 NOTE — Progress Notes (Unsigned)
Complete physical exam  I,April Miller,acting as a scribe for Wilhemena Durie, MD.,have documented all relevant documentation on the behalf of Wilhemena Durie, MD,as directed by  Wilhemena Durie, MD while in the presence of Wilhemena Durie, MD.   Patient: Gregory Avila   DOB: Apr 12, 1961   61 y.o. Male  MRN: [id number] Visit Date: 02/04/2022  Today's healthcare provider: Wilhemena Durie, MD   Chief Complaint  Patient presents with   Annual Exam   Subjective    Gregory Avila is a 61 y.o. male who presents today for a complete physical exam.  He is married and is doing well.  He has plans to retire in 2 years. HPI  He had chest pain 2 nights ago and is having a little lower chest pain left-sided chest pain right now. Pain was not exertional when he came into the office.  He is having no associated symptoms.  He has a long history of reflux.  Past Medical History:  Diagnosis Date   Esophageal reflux    Hiatal hernia    Peptic stricture of esophagus    Schatzki's ring    Past Surgical History:  Procedure Laterality Date   APPENDECTOMY     WRIST SURGERY     right x3, plate   Social History   Socioeconomic History   Marital status: Married    Spouse name: Not on file   Number of children: 4   Years of education: Not on file   Highest education level: Not on file  Occupational History   Occupation: Optometrist  Tobacco Use   Smoking status: Never   Smokeless tobacco: Never  Vaping Use   Vaping Use: Never used  Substance and Sexual Activity   Alcohol use: No   Drug use: No   Sexual activity: Not on file  Other Topics Concern   Not on file  Social History Narrative   Occ Caffeine    Social Determinants of Health   Financial Resource Strain: Not on file  Food Insecurity: Not on file  Transportation Needs: Not on file  Physical Activity: Not on file  Stress: Not on file  Social Connections: Not on file  Intimate Partner Violence: Not on  file   Family Status  Relation Name Status   Mother  Alive   Father  Deceased   Brother  Alive   PGF  (Not Specified)   Neg Hx  (Not Specified)   Family History  Problem Relation Age of Onset   Breast cancer Mother    Other Father        small cell carcinoma of his lung   Pancreatic cancer Paternal Grandfather    Colon cancer Neg Hx    Esophageal cancer Neg Hx    Rectal cancer Neg Hx    Stomach cancer Neg Hx    No Known Allergies  Patient Care Team: Jerrol Banana., MD as PCP - General (Unknown Physician Specialty)   Medications: Outpatient Medications Prior to Visit  Medication Sig   pantoprazole (PROTONIX) 20 MG tablet TAKE 1 TABLET (20 MG TOTAL) BY MOUTH DAILY. TAKE 1 TABLET DAILY NOT 2 -DUE TO INSURANCE COVERAGE   No facility-administered medications prior to visit.    Review of Systems  All other systems reviewed and are negative.   Last lipids Lab Results  Component Value Date   CHOL 214 (H) 02/04/2022   HDL 46 02/04/2022   LDLCALC 132 (H) 02/04/2022  TRIG 204 (H) 02/04/2022   CHOLHDL 4.7 02/04/2022      Objective     BP 120/85 (BP Location: Right Arm, Patient Position: Sitting, Cuff Size: Large)   Pulse 71   Resp 14   Ht 5' 9" (1.753 m)   Wt 226 lb (102.5 kg)   SpO2 98%   BMI 33.37 kg/m  BP Readings from Last 3 Encounters:  02/04/22 120/85  09/06/20 126/89  09/08/19 107/75   Wt Readings from Last 3 Encounters:  02/04/22 226 lb (102.5 kg)  09/06/20 225 lb 3.2 oz (102.2 kg)  09/08/19 226 lb (102.5 kg)       Physical Exam Vitals reviewed.  Constitutional:      Appearance: He is obese.  HENT:     Head: Normocephalic and atraumatic.     Right Ear: External ear normal.     Left Ear: External ear normal.     Nose: Nose normal.     Mouth/Throat:     Pharynx: Oropharynx is clear.  Eyes:     General: No scleral icterus.    Conjunctiva/sclera: Conjunctivae normal.  Cardiovascular:     Rate and Rhythm: Normal rate and regular  rhythm.     Pulses: Normal pulses.     Heart sounds: Normal heart sounds.  Pulmonary:     Effort: Pulmonary effort is normal.     Breath sounds: Normal breath sounds.  Abdominal:     Palpations: Abdomen is soft.  Genitourinary:    Penis: Normal.      Testes: Normal.  Musculoskeletal:     Right lower leg: No edema.     Left lower leg: No edema.  Lymphadenopathy:     Cervical: No cervical adenopathy.  Skin:    General: Skin is warm and dry.  Neurological:     General: No focal deficit present.     Mental Status: He is alert and oriented to person, place, and time.  Psychiatric:        Mood and Affect: Mood normal.        Behavior: Behavior normal.        Thought Content: Thought content normal.        Judgment: Judgment normal.     EKG reveals normal sinus rhythm with rate about 70 with first-degree AV block and no ischemic changes.  Last depression screening scores    02/04/2022    1:29 PM 09/06/2020    9:50 AM 09/08/2019   10:43 AM  PHQ 2/9 Scores  PHQ - 2 Score 0 0 0  PHQ- 9 Score 0 0 0   Last fall risk screening    02/04/2022    1:29 PM  [place] in the past year? 0  Number falls in past yr: 0  Injury with Fall? 0  Risk for fall due to : No Fall Risks  Follow up Falls evaluation completed   Last Audit-C alcohol use screening    02/04/2022    1:29 PM  Alcohol Use Disorder Test (AUDIT)  1. How often do you have a drink containing alcohol? 1  2. How many drinks containing alcohol do you have on a typical day when you are drinking? 0  3. How often do you have six or more drinks on one occasion? 0  AUDIT-C Score 1   A score of 3 or more in women, and 4 or more in men indicates increased risk for alcohol abuse, EXCEPT if all of the points are  from question 1   Results for orders placed or performed in visit on 02/04/22  Lipid panel  Result Value Ref Range   Cholesterol, Total 214 (H) 100 - 199 mg/dL   Triglycerides 204 (H) 0 - 149 mg/dL   HDL 46 >39  mg/dL   VLDL Cholesterol Cal 36 5 - 40 mg/dL   LDL Chol Calc (NIH) 132 (H) 0 - 99 mg/dL   Chol/HDL Ratio 4.7 0.0 - 5.0 ratio  TSH  Result Value Ref Range   TSH 2.620 0.450 - 4.500 uIU/mL  CBC w/Diff/Platelet  Result Value Ref Range   WBC 6.5 3.4 - 10.8 x10E3/uL   RBC 5.59 4.14 - 5.80 x10E6/uL   Hemoglobin 16.5 13.0 - 17.7 g/dL   Hematocrit 46.7 37.5 - 51.0 %   MCV 84 79 - 97 fL   MCH 29.5 26.6 - 33.0 pg   MCHC 35.3 31.5 - 35.7 g/dL   RDW 12.7 11.6 - 15.4 %   Platelets 264 150 - 450 x10E3/uL   Neutrophils 59 Not Estab. %   Lymphs 27 Not Estab. %   Monocytes 8 Not Estab. %   Eos 5 Not Estab. %   Basos 1 Not Estab. %   Neutrophils Absolute 3.9 1.4 - 7.0 x10E3/uL   Lymphocytes Absolute 1.7 0.7 - 3.1 x10E3/uL   Monocytes Absolute 0.5 0.1 - 0.9 x10E3/uL   EOS (ABSOLUTE) 0.3 0.0 - 0.4 x10E3/uL   Basophils Absolute 0.0 0.0 - 0.2 x10E3/uL   Immature Granulocytes 0 Not Estab. %   Immature Grans (Abs) 0.0 0.0 - 0.1 x10E3/uL  Comprehensive Metabolic Panel (CMET)  Result Value Ref Range   Glucose 86 70 - 99 mg/dL   BUN 11 8 - 27 mg/dL   Creatinine, Ser 1.21 0.76 - 1.27 mg/dL   eGFR 69 >59 mL/min/1.73   BUN/Creatinine Ratio 9 (L) 10 - 24   Sodium 142 134 - 144 mmol/L   Potassium 4.7 3.5 - 5.2 mmol/L   Chloride 102 96 - 106 mmol/L   CO2 26 20 - 29 mmol/L   Calcium 10.3 (H) 8.6 - 10.2 mg/dL   Total Protein 7.3 6.0 - 8.5 g/dL   Albumin 4.6 3.8 - 4.9 g/dL   Globulin, Total 2.7 1.5 - 4.5 g/dL   Albumin/Globulin Ratio 1.7 1.2 - 2.2   Bilirubin Total 0.4 0.0 - 1.2 mg/dL   Alkaline Phosphatase 94 44 - 121 IU/L   AST 19 0 - 40 IU/L   ALT 23 0 - 44 IU/L  PSA  Result Value Ref Range   Prostate Specific Ag, Serum 2.3 0.0 - 4.0 ng/mL    Assessment & Plan    Routine Health Maintenance and Physical Exam  Exercise Activities and Dietary recommendations  Goals   None     Immunization History  Administered Date(s) Administered   Tdap 08/02/2013   Zoster Recombinat (Shingrix)  09/08/2019, 03/13/2020    Health Maintenance  Topic Date Due   INFLUENZA VACCINE  01/10/2023 (Originally 12/11/2021)   COVID-19 Vaccine (1) 02/05/2023 (Originally 02/25/1961)   COLONOSCOPY (Pts 45-18yr Insurance coverage will need to be confirmed)  03/11/2022   TETANUS/TDAP  08/03/2023   Hepatitis C Screening  Completed   HIV Screening  Completed   Zoster Vaccines- Shingrix  Completed   HPV VACCINES  Aged Out    Discussed health benefits of physical activity, and encouraged him to engage in regular exercise appropriate for his age and condition.  1. Annual physical exam Back to GI  for colonoscopy. - Lipid panel - TSH - CBC w/Diff/Platelet - Comprehensive Metabolic Panel (CMET)  2. Gastroesophageal reflux disease, unspecified whether esophagitis present Continue pantoprazole indefinitely. - Lipid panel - TSH - CBC w/Diff/Platelet - Comprehensive Metabolic Panel (CMET)  3. Screening for prostate cancer  - PSA  4. Chest pain, unspecified type I do Not think this is cardiac.  Call for cardiac referral if this persists or does not resolve. Continue pantoprazole and pay attention to his symptoms. - DG Chest 2 View  5. Screening for colon cancer  - Ambulatory referral to Gastroenterology  6. Chest tightness I think this is reflux related.   No follow-ups on file.    I, Wilhemena Durie, MD, have reviewed all documentation for this visit. The documentation on 02/06/22 for the exam, diagnosis, procedures, and orders are all accurate and complete.    Richard Cranford Mon, MD  [facility] [phone] (phone) [phone] (fax)  [place]

## 2022-02-04 ENCOUNTER — Encounter: Payer: Self-pay | Admitting: Family Medicine

## 2022-02-04 ENCOUNTER — Ambulatory Visit
Admission: RE | Admit: 2022-02-04 | Discharge: 2022-02-04 | Disposition: A | Discharge status: Home / Self Care | Payer: 59 | Attending: Family Medicine | Admitting: Family Medicine

## 2022-02-04 ENCOUNTER — Ambulatory Visit (INDEPENDENT_AMBULATORY_CARE_PROVIDER_SITE_OTHER): Payer: 59 | Admitting: Family Medicine

## 2022-02-04 ENCOUNTER — Ambulatory Visit
Admission: RE | Admit: 2022-02-04 | Discharge: 2022-02-04 | Disposition: A | Discharge status: Home / Self Care | Payer: 59 | Source: Ambulatory Visit | Attending: Family Medicine | Admitting: Family Medicine

## 2022-02-04 VITALS — BP 120/85 | HR 71 | Resp 14 | Ht 69.0 in | Wt 226.0 lb

## 2022-02-04 DIAGNOSIS — K219 Gastro-esophageal reflux disease without esophagitis: Secondary | ICD-10-CM

## 2022-02-04 DIAGNOSIS — R079 Chest pain, unspecified: Secondary | ICD-10-CM | POA: Insufficient documentation

## 2022-02-04 DIAGNOSIS — Z1211 Encounter for screening for malignant neoplasm of colon: Secondary | ICD-10-CM | POA: Diagnosis not present

## 2022-02-04 DIAGNOSIS — Z0000 Encounter for general adult medical examination without abnormal findings: Secondary | ICD-10-CM | POA: Diagnosis not present

## 2022-02-04 DIAGNOSIS — Z125 Encounter for screening for malignant neoplasm of prostate: Secondary | ICD-10-CM | POA: Diagnosis not present

## 2022-02-04 DIAGNOSIS — R0789 Other chest pain: Secondary | ICD-10-CM

## 2022-02-05 LAB — CBC WITH DIFFERENTIAL/PLATELET
Basophils Absolute: 0 10*3/uL (ref 0.0–0.2)
Basos: 1 %
EOS (ABSOLUTE): 0.3 10*3/uL (ref 0.0–0.4)
Eos: 5 %
Hematocrit: 46.7 % (ref 37.5–51.0)
Hemoglobin: 16.5 g/dL (ref 13.0–17.7)
Immature Grans (Abs): 0 10*3/uL (ref 0.0–0.1)
Immature Granulocytes: 0 %
Lymphocytes Absolute: 1.7 10*3/uL (ref 0.7–3.1)
Lymphs: 27 %
MCH: 29.5 pg (ref 26.6–33.0)
MCHC: 35.3 g/dL (ref 31.5–35.7)
MCV: 84 fL (ref 79–97)
Monocytes Absolute: 0.5 10*3/uL (ref 0.1–0.9)
Monocytes: 8 %
Neutrophils Absolute: 3.9 10*3/uL (ref 1.4–7.0)
Neutrophils: 59 %
Platelets: 264 10*3/uL (ref 150–450)
RBC: 5.59 x10E6/uL (ref 4.14–5.80)
RDW: 12.7 % (ref 11.6–15.4)
WBC: 6.5 10*3/uL (ref 3.4–10.8)

## 2022-02-05 LAB — COMPREHENSIVE METABOLIC PANEL
ALT: 23 IU/L (ref 0–44)
AST: 19 IU/L (ref 0–40)
Albumin/Globulin Ratio: 1.7 (ref 1.2–2.2)
Albumin: 4.6 g/dL (ref 3.8–4.9)
Alkaline Phosphatase: 94 IU/L (ref 44–121)
BUN/Creatinine Ratio: 9 — ABNORMAL LOW (ref 10–24)
BUN: 11 mg/dL (ref 8–27)
Bilirubin Total: 0.4 mg/dL (ref 0.0–1.2)
CO2: 26 mmol/L (ref 20–29)
Calcium: 10.3 mg/dL — ABNORMAL HIGH (ref 8.6–10.2)
Chloride: 102 mmol/L (ref 96–106)
Creatinine, Ser: 1.21 mg/dL (ref 0.76–1.27)
Globulin, Total: 2.7 g/dL (ref 1.5–4.5)
Glucose: 86 mg/dL (ref 70–99)
Potassium: 4.7 mmol/L (ref 3.5–5.2)
Sodium: 142 mmol/L (ref 134–144)
Total Protein: 7.3 g/dL (ref 6.0–8.5)
eGFR: 69 mL/min/{1.73_m2} (ref >59)

## 2022-02-05 LAB — LIPID PANEL
Chol/HDL Ratio: 4.7 ratio (ref 0.0–5.0)
Cholesterol, Total: 214 mg/dL — ABNORMAL HIGH (ref 100–199)
HDL: 46 mg/dL (ref >39)
LDL Chol Calc (NIH): 132 mg/dL — ABNORMAL HIGH (ref 0–99)
Triglycerides: 204 mg/dL — ABNORMAL HIGH (ref 0–149)
VLDL Cholesterol Cal: 36 mg/dL (ref 5–40)

## 2022-02-05 LAB — PSA: Prostate Specific Ag, Serum: 2.3 ng/mL (ref 0.0–4.0)

## 2022-02-05 LAB — TSH: TSH: 2.62 u[IU]/mL (ref 0.450–4.500)

## 2022-02-17 ENCOUNTER — Other Ambulatory Visit: Payer: Self-pay | Admitting: Family Medicine

## 2022-02-18 NOTE — Telephone Encounter (Signed)
Called pt - LMOMTCB regarding appointment.

## 2022-02-18 NOTE — Telephone Encounter (Signed)
Requested medications are due for refill today.  yes  Requested medications are on the active medications list.  yes  Last refill. 08/17/2021 #90 1 rf  Future visit scheduled.   no  Notes to clinic.  Dr. Rosanna Randy pt.    Requested Prescriptions  Pending Prescriptions Disp Refills   pantoprazole (PROTONIX) 20 MG tablet [Pharmacy Med Name: PANTOPRAZOLE SOD DR 20 MG TAB] 90 tablet 1    Sig: TAKE 1 TABLET (20 MG TOTAL) BY MOUTH DAILY. TAKE 1 TABLET DAILY NOT 2 -DUE TO INSURANCE COVERAGE     Gastroenterology: Proton Pump Inhibitors Passed - 02/17/2022  9:36 AM      Passed - Valid encounter within last 12 months    Recent Outpatient Visits           2 weeks ago Annual physical exam   [facility] Jerrol Banana., MD   1 year ago Annual physical exam   [facility] Jerrol Banana., MD   1 year ago Need for shingles vaccine   [facility] Jerrol Banana., MD   2 years ago Annual physical exam   [facility] Jerrol Banana., MD   4 years ago Annual physical exam   [facility] Jerrol Banana., MD

## 2022-03-01 ENCOUNTER — Encounter: Payer: Self-pay | Admitting: Gastroenterology

## 2022-08-24 ENCOUNTER — Other Ambulatory Visit: Payer: Self-pay | Admitting: Family Medicine

## 2022-11-21 DIAGNOSIS — Z872 Personal history of diseases of the skin and subcutaneous tissue: Secondary | ICD-10-CM | POA: Diagnosis not present

## 2022-11-21 DIAGNOSIS — Z85820 Personal history of malignant melanoma of skin: Secondary | ICD-10-CM | POA: Diagnosis not present

## 2022-11-21 DIAGNOSIS — D2261 Melanocytic nevi of right upper limb, including shoulder: Secondary | ICD-10-CM | POA: Diagnosis not present

## 2022-11-21 DIAGNOSIS — D2262 Melanocytic nevi of left upper limb, including shoulder: Secondary | ICD-10-CM | POA: Diagnosis not present

## 2022-11-21 DIAGNOSIS — L570 Actinic keratosis: Secondary | ICD-10-CM | POA: Diagnosis not present

## 2022-11-21 DIAGNOSIS — D225 Melanocytic nevi of trunk: Secondary | ICD-10-CM | POA: Diagnosis not present

## 2022-11-21 DIAGNOSIS — D2271 Melanocytic nevi of right lower limb, including hip: Secondary | ICD-10-CM | POA: Diagnosis not present

## 2022-12-24 DIAGNOSIS — M9901 Segmental and somatic dysfunction of cervical region: Secondary | ICD-10-CM | POA: Diagnosis not present

## 2022-12-24 DIAGNOSIS — M542 Cervicalgia: Secondary | ICD-10-CM | POA: Diagnosis not present

## 2022-12-24 DIAGNOSIS — M9902 Segmental and somatic dysfunction of thoracic region: Secondary | ICD-10-CM | POA: Diagnosis not present

## 2022-12-24 DIAGNOSIS — M546 Pain in thoracic spine: Secondary | ICD-10-CM | POA: Diagnosis not present

## 2023-02-06 ENCOUNTER — Encounter: Payer: 59 | Admitting: Family Medicine

## 2023-02-06 NOTE — Progress Notes (Deleted)
Complete physical exam   Patient: Gregory Avila   DOB: 1961-03-17   62 y.o. Male  MRN: [id number] Visit Date: 02/06/2023  Today's healthcare provider: Sherlyn Hay, DO   No chief complaint on file.  Subjective    Gregory Avila is a 62 y.o. male who presents today for a complete physical exam.  He reports consuming a {diet types:17450} diet. {Exercise:19826} He generally feels {well/fairly well/poorly:18703}. He reports sleeping {well/fairly well/poorly:18703}. He {does/does not:200015} have additional problems to discuss today.  HPI      ***  Past Medical History:  Diagnosis Date   Esophageal reflux    Hiatal hernia    Peptic stricture of esophagus    Schatzki's ring    Past Surgical History:  Procedure Laterality Date   APPENDECTOMY     WRIST SURGERY     right x3, plate   Social History   Socioeconomic History   Marital status: Married    Spouse name: Not on file   Number of children: 4   Years of education: Not on file   Highest education level: Not on file  Occupational History   Occupation: Research scientist (medical)  Tobacco Use   Smoking status: Never   Smokeless tobacco: Never  Vaping Use   Vaping status: Never Used  Substance and Sexual Activity   Alcohol use: No   Drug use: No   Sexual activity: Not on file  Other Topics Concern   Not on file  Social History Narrative   Occ Caffeine    Social Determinants of Health   Financial Resource Strain: Not on file  Food Insecurity: Not on file  Transportation Needs: Not on file  Physical Activity: Not on file  Stress: Not on file  Social Connections: Not on file  Intimate Partner Violence: Not on file   Family Status  Relation Name Status   Mother  Alive   Father  Deceased   Brother  Alive   PGF  (Not Specified)   Neg Hx  (Not Specified)  No partnership data on file   Family History  Problem Relation Age of Onset   Breast cancer Mother    Other Father        small cell carcinoma of his lung    Pancreatic cancer Paternal Grandfather    Colon cancer Neg Hx    Esophageal cancer Neg Hx    Rectal cancer Neg Hx    Stomach cancer Neg Hx    No Known Allergies  Patient Care Team: Bosie Clos, MD as PCP - General (Unknown Physician Specialty)   Medications: Outpatient Medications Prior to Visit  Medication Sig   pantoprazole (PROTONIX) 20 MG tablet TAKE 1 TABLET (20 MG TOTAL) BY MOUTH DAILY. TAKE 1 TABLET DAILY NOT 2 -DUE TO INSURANCE COVERAGE   No facility-administered medications prior to visit.    Review of Systems {Insert previous labs (optional):23779} {See past labs  Heme  Chem  Endocrine  Serology  Results Review (optional):1}  Objective    There were no vitals taken for this visit. {Insert last BP/Wt (optional):23777}{See vitals history (optional):1}  Physical Exam  ***  Last depression screening scores    02/04/2022    1:29 PM 09/06/2020    9:50 AM 09/08/2019   10:43 AM  PHQ 2/9 Scores  PHQ - 2 Score 0 0 0  PHQ- 9 Score 0 0 0   Last fall risk screening    02/04/2022    1:29 PM  Fall Risk   Falls in the past year? 0  Number falls in past yr: 0  Injury with Fall? 0  Risk for fall due to : No Fall Risks  Follow up Falls evaluation completed   Last Audit-C alcohol use screening    02/04/2022    1:29 PM  Alcohol Use Disorder Test (AUDIT)  1. How often do you have a drink containing alcohol? 1  2. How many drinks containing alcohol do you have on a typical day when you are drinking? 0  3. How often do you have six or more drinks on one occasion? 0  AUDIT-C Score 1   A score of 3 or more in women, and 4 or more in men indicates increased risk for alcohol abuse, EXCEPT if all of the points are from question 1   No results found for any visits on 02/06/23.  Assessment & Plan    Routine Health Maintenance and Physical Exam  Exercise Activities and Dietary recommendations  Goals   None     Immunization History  Administered Date(s)  Administered   Tdap 08/02/2013   Zoster Recombinant(Shingrix) 09/08/2019, 03/13/2020    Health Maintenance  Topic Date Due   Colonoscopy  03/11/2022   INFLUENZA VACCINE  Never done   COVID-19 Vaccine (1 - 2023-24 season) Never done   DTaP/Tdap/Td (2 - Td or Tdap) 08/03/2023   Hepatitis C Screening  Completed   HIV Screening  Completed   Zoster Vaccines- Shingrix  Completed   HPV VACCINES  Aged Out    Discussed health benefits of physical activity, and encouraged him to engage in regular exercise appropriate for his age and condition.   There are no diagnoses linked to this encounter.   ***  No follow-ups on file.     I discussed the assessment and treatment plan with the patient  The patient was provided an opportunity to ask questions and all were answered. The patient agreed with the plan and demonstrated an understanding of the instructions.   The patient was advised to call back or seek an in-person evaluation if the symptoms worsen or if the condition fails to improve as anticipated.    Sherlyn Hay, DO  [facility] [phone] (phone) [phone] (fax)  [facility]

## 2023-02-18 DIAGNOSIS — M9902 Segmental and somatic dysfunction of thoracic region: Secondary | ICD-10-CM | POA: Diagnosis not present

## 2023-02-18 DIAGNOSIS — M9901 Segmental and somatic dysfunction of cervical region: Secondary | ICD-10-CM | POA: Diagnosis not present

## 2023-02-18 DIAGNOSIS — M542 Cervicalgia: Secondary | ICD-10-CM | POA: Diagnosis not present

## 2023-02-18 DIAGNOSIS — M546 Pain in thoracic spine: Secondary | ICD-10-CM | POA: Diagnosis not present

## 2023-03-04 DIAGNOSIS — M546 Pain in thoracic spine: Secondary | ICD-10-CM | POA: Diagnosis not present

## 2023-03-04 DIAGNOSIS — M542 Cervicalgia: Secondary | ICD-10-CM | POA: Diagnosis not present

## 2023-03-04 DIAGNOSIS — M9902 Segmental and somatic dysfunction of thoracic region: Secondary | ICD-10-CM | POA: Diagnosis not present

## 2023-03-04 DIAGNOSIS — M9901 Segmental and somatic dysfunction of cervical region: Secondary | ICD-10-CM | POA: Diagnosis not present

## 2023-04-17 DIAGNOSIS — K219 Gastro-esophageal reflux disease without esophagitis: Secondary | ICD-10-CM | POA: Diagnosis not present

## 2023-04-17 DIAGNOSIS — Z86006 Personal history of melanoma in-situ: Secondary | ICD-10-CM | POA: Diagnosis not present

## 2023-04-17 DIAGNOSIS — Z1211 Encounter for screening for malignant neoplasm of colon: Secondary | ICD-10-CM | POA: Diagnosis not present

## 2023-04-17 DIAGNOSIS — Z1389 Encounter for screening for other disorder: Secondary | ICD-10-CM | POA: Diagnosis not present

## 2023-04-17 DIAGNOSIS — Z125 Encounter for screening for malignant neoplasm of prostate: Secondary | ICD-10-CM | POA: Diagnosis not present

## 2023-04-17 DIAGNOSIS — Z0000 Encounter for general adult medical examination without abnormal findings: Secondary | ICD-10-CM | POA: Diagnosis not present

## 2023-04-17 DIAGNOSIS — R5383 Other fatigue: Secondary | ICD-10-CM | POA: Diagnosis not present

## 2023-04-17 DIAGNOSIS — R0683 Snoring: Secondary | ICD-10-CM | POA: Diagnosis not present

## 2023-10-21 ENCOUNTER — Encounter: Payer: Self-pay | Admitting: Gastroenterology

## 2023-11-06 ENCOUNTER — Encounter: Payer: Self-pay | Admitting: Gastroenterology

## 2023-11-06 ENCOUNTER — Ambulatory Visit: Admitting: Registered Nurse

## 2023-11-06 ENCOUNTER — Other Ambulatory Visit: Payer: Self-pay

## 2023-11-06 ENCOUNTER — Ambulatory Visit
Admission: RE | Admit: 2023-11-06 | Discharge: 2023-11-06 | Disposition: A | Discharge status: Home / Self Care | Payer: Self-pay | Attending: Gastroenterology | Admitting: Gastroenterology

## 2023-11-06 ENCOUNTER — Encounter
Admission: RE | Disposition: A | Discharge status: Home / Self Care | Payer: Self-pay | Source: Home / Self Care | Attending: Gastroenterology

## 2023-11-06 DIAGNOSIS — Z1211 Encounter for screening for malignant neoplasm of colon: Secondary | ICD-10-CM | POA: Insufficient documentation

## 2023-11-06 DIAGNOSIS — Z79899 Other long term (current) drug therapy: Secondary | ICD-10-CM | POA: Diagnosis not present

## 2023-11-06 DIAGNOSIS — K2970 Gastritis, unspecified, without bleeding: Secondary | ICD-10-CM | POA: Diagnosis not present

## 2023-11-06 DIAGNOSIS — K222 Esophageal obstruction: Secondary | ICD-10-CM | POA: Diagnosis not present

## 2023-11-06 DIAGNOSIS — K2100 Gastro-esophageal reflux disease with esophagitis, without bleeding: Secondary | ICD-10-CM | POA: Insufficient documentation

## 2023-11-06 DIAGNOSIS — K2980 Duodenitis without bleeding: Secondary | ICD-10-CM | POA: Insufficient documentation

## 2023-11-06 DIAGNOSIS — K449 Diaphragmatic hernia without obstruction or gangrene: Secondary | ICD-10-CM | POA: Diagnosis not present

## 2023-11-06 HISTORY — PX: COLONOSCOPY: SHX5424

## 2023-11-06 HISTORY — PX: ESOPHAGOGASTRODUODENOSCOPY: SHX5428

## 2023-11-06 SURGERY — COLONOSCOPY
Anesthesia: General

## 2023-11-06 MED ORDER — LIDOCAINE HCL (PF) 2 % IJ SOLN
INTRAMUSCULAR | Status: AC
Start: 2023-11-06 — End: 2023-11-06
  Filled 2023-11-06: qty 5

## 2023-11-06 MED ORDER — GLYCOPYRROLATE 0.2 MG/ML IJ SOLN
INTRAMUSCULAR | Status: DC | PRN
  Administered 2023-11-06: .2 mg via INTRAVENOUS

## 2023-11-06 MED ORDER — PROPOFOL 10 MG/ML IV BOLUS
INTRAVENOUS | Status: AC
Start: 2023-11-06 — End: 2023-11-06
  Filled 2023-11-06: qty 20

## 2023-11-06 MED ORDER — PROPOFOL 500 MG/50ML IV EMUL
INTRAVENOUS | Status: DC | PRN
  Administered 2023-11-06: 150 ug/kg/min via INTRAVENOUS

## 2023-11-06 MED ORDER — GLYCOPYRROLATE 0.2 MG/ML IJ SOLN
INTRAMUSCULAR | Status: AC
Start: 2023-11-06 — End: 2023-11-06
  Filled 2023-11-06: qty 1

## 2023-11-06 MED ORDER — SODIUM CHLORIDE 0.9 % IV SOLN: INTRAVENOUS | Status: DC

## 2023-11-06 MED ORDER — PROPOFOL 1000 MG/100ML IV EMUL
INTRAVENOUS | Status: AC
  Filled 2023-11-06: qty 100

## 2023-11-06 MED ORDER — LIDOCAINE HCL (CARDIAC) PF 100 MG/5ML IV SOSY
PREFILLED_SYRINGE | INTRAVENOUS | Status: DC | PRN
  Administered 2023-11-06: 40 mg via INTRAVENOUS

## 2023-11-06 MED ORDER — PROPOFOL 10 MG/ML IV BOLUS
INTRAVENOUS | Status: DC | PRN
  Administered 2023-11-06: 70 mg via INTRAVENOUS

## 2023-11-06 NOTE — Anesthesia Postprocedure Evaluation (Signed)
 Anesthesia Post Note  Patient: Gregory Avila  Procedure(s) Performed: COLONOSCOPY EGD (ESOPHAGOGASTRODUODENOSCOPY) Balloon dilation wire-guided  Patient location during evaluation: PACU Anesthesia Type: General Level of consciousness: awake and awake and alert Pain management: satisfactory to patient Vital Signs Assessment: post-procedure vital signs reviewed and stable Respiratory status: spontaneous breathing Cardiovascular status: stable Anesthetic complications: no   No notable events documented.   Last Vitals:  Vitals:   11/06/23 1110 11/06/23 1112  BP:    Pulse: 72   Resp: 14 11  Temp:    SpO2: 99%     Last Pain:  Vitals:   11/06/23 1040  TempSrc: Temporal  PainSc:                  VAN STAVEREN,Raychel Dowler

## 2023-11-06 NOTE — Interval H&P Note (Signed)
 History and Physical Interval Note: Preprocedure H&P from 11/06/23  was reviewed and there was no interval change after seeing and examining the patient.  Written consent was obtained from the patient after discussion of risks, benefits, and alternatives. Patient has consented to proceed with Esophagogastroduodenoscopy and Colonoscopy with possible intervention   11/06/2023 9:58 AM  Gregory Avila  has presented today for surgery, with the diagnosis of Colon cancer screening (Z12.11) Esophageal dysphagia (R13.19) History of esophageal stricture (Z87.19) GERD without esophagitis (K21.9).  The various methods of treatment have been discussed with the patient and family. After consideration of risks, benefits and other options for treatment, the patient has consented to  Procedure(s): COLONOSCOPY (N/A) EGD (ESOPHAGOGASTRODUODENOSCOPY) (N/A) as a surgical intervention.  The patient's history has been reviewed, patient examined, no change in status, stable for surgery.  I have reviewed the patient's chart and labs.  Questions were answered to the patient's satisfaction.     [name]

## 2023-11-06 NOTE — Transfer of Care (Signed)
 Immediate Anesthesia Transfer of Care Note  Patient: Gregory Avila  Procedure(s) Performed: COLONOSCOPY EGD (ESOPHAGOGASTRODUODENOSCOPY) Balloon dilation wire-guided  Patient Location: PACU  Anesthesia Type:General  Level of Consciousness: drowsy and patient cooperative  Airway & Oxygen Therapy: Patient Spontanous Breathing  Post-op Assessment: Report given to RN and Post -op Vital signs reviewed and stable  Post vital signs: stable  Last Vitals:  Vitals Value Taken Time  BP 101/77 11/06/23 10:47  Temp    Pulse 75 11/06/23 10:48  Resp 15 11/06/23 10:48  SpO2 93 % 11/06/23 10:48  Vitals shown include unfiled device data.  Last Pain:  Vitals:   11/06/23 1040  TempSrc: Temporal  PainSc:          Complications: No notable events documented.

## 2023-11-06 NOTE — H&P (Signed)
 Pre-Procedure H&P   Patient ID: Gregory Avila is a 63 y.o. male.  Gastroenterology Provider: [name], DO  Referring Provider: Jonette Primmer, PA PCP: Gregory Charlie CROME, MD  Date: 11/06/2023  HPI Gregory Avila is a 63 y.o. male who presents today for Esophagogastroduodenoscopy and Colonoscopy for Dysphagia, esophageal stricture, GERD, colorectal cancer screening .  Patient last underwent EGD and colonoscopy in 2013.  He was having difficulty swallowing solids at that point and was reportedly dilated.  Over the last 7 months he has had increasing solid dysphagia.  He feels suprasternal sticking especially with things like meats and breads and other solids. Occurs a 2-3 times a month. No pill or liquid dysphagia.  No odynophagia.  He does note regurgitation.  This began developing after he was switched from PPI to famotidine  He reports regular bowel movements without melena or hematochezia  Creatinine 1.2 hemoglobin 16.5 MCV 85.4 platelets 278,000   Past Medical History:  Diagnosis Date   Esophageal reflux    Hiatal hernia    Peptic stricture of esophagus    Schatzki's ring     Past Surgical History:  Procedure Laterality Date   APPENDECTOMY     WRIST SURGERY     right x3, plate    Family History No h/o GI disease or malignancy  Review of Systems  Constitutional:  Negative for activity change, appetite change, chills, diaphoresis, fatigue, fever and unexpected weight change.  HENT:  Positive for trouble swallowing. Negative for voice change.   Respiratory:  Negative for shortness of breath and wheezing.   Cardiovascular:  Negative for chest pain, palpitations and leg swelling.  Gastrointestinal:  Negative for abdominal distention, abdominal pain, anal bleeding, blood in stool, constipation, diarrhea, nausea and vomiting.  Musculoskeletal:  Negative for arthralgias and myalgias.  Skin:  Negative for color change and pallor.  Neurological:  Negative  for dizziness, syncope and weakness.  Psychiatric/Behavioral:  Negative for confusion. The patient is not nervous/anxious.   All other systems reviewed and are negative.    Medications No current facility-administered medications on file prior to encounter.   Current Outpatient Medications on File Prior to Encounter  Medication Sig Dispense Refill   famotidine (PEPCID) 10 MG tablet Take 10 mg by mouth 2 (two) times daily.     pantoprazole  (PROTONIX ) 20 MG tablet TAKE 1 TABLET (20 MG TOTAL) BY MOUTH DAILY. TAKE 1 TABLET DAILY NOT 2 -DUE TO INSURANCE COVERAGE (Patient not taking: Reported on 11/06/2023) 90 tablet 1    Pertinent medications related to GI and procedure were reviewed by me with the patient prior to the procedure  No current facility-administered medications for this encounter.      No Known Allergies Allergies were reviewed by me prior to the procedure  Objective   Body mass index is 32.19 kg/m. Vitals:   11/06/23 0940 11/06/23 0941  BP:  127/72  Pulse: 70   Resp: 18   Temp: (!) 96.1 F (35.6 C)   TempSrc: Temporal   SpO2: 99%   Weight: 98.9 kg   Height: 5' 9 (1.753 m)      Physical Exam Vitals and nursing note reviewed.  Constitutional:      General: He is not in acute distress.    Appearance: Normal appearance. He is obese. He is not ill-appearing, toxic-appearing or diaphoretic.  HENT:     Head: Normocephalic and atraumatic.     Nose: Nose normal.     Mouth/Throat:  Mouth: Mucous membranes are moist.     Pharynx: Oropharynx is clear.   Eyes:     General: No scleral icterus.    Extraocular Movements: Extraocular movements intact.    Cardiovascular:     Rate and Rhythm: Normal rate and regular rhythm.     Heart sounds: Normal heart sounds. No murmur heard.    No friction rub. No gallop.  Pulmonary:     Effort: Pulmonary effort is normal. No respiratory distress.     Breath sounds: Normal breath sounds. No wheezing, rhonchi or rales.   Abdominal:     General: Bowel sounds are normal. There is no distension.     Palpations: Abdomen is soft.     Tenderness: There is no abdominal tenderness. There is no guarding or rebound.   Musculoskeletal:     Cervical back: Neck supple.     Right lower leg: No edema.     Left lower leg: No edema.   Skin:    General: Skin is warm and dry.     Coloration: Skin is not jaundiced or pale.   Neurological:     General: No focal deficit present.     Mental Status: He is alert and oriented to person, place, and time. Mental status is at baseline.   Psychiatric:        Mood and Affect: Mood normal.        Behavior: Behavior normal.        Thought Content: Thought content normal.        Judgment: Judgment normal.      Assessment:  Gregory Avila is a 63 y.o. male  who presents today for Esophagogastroduodenoscopy and Colonoscopy for Dysphagia, esophageal stricture, GERD, colorectal cancer screening .  Plan:  Esophagogastroduodenoscopy and Colonoscopy with possible intervention today  Esophagogastroduodenoscopy and Colonoscopy with possible biopsy, control of bleeding, polypectomy, and interventions as necessary has been discussed with the patient/patient representative. Informed consent was obtained from the patient/patient representative after explaining the indication, nature, and risks of the procedure including but not limited to death, bleeding, perforation, missed neoplasm/lesions, cardiorespiratory compromise, and reaction to medications. Opportunity for questions was given and appropriate answers were provided. Patient/patient representative has verbalized understanding is amenable to undergoing the procedure.   Gregory Ozell Jungling, DO  [facility]  Portions of the record may have been created with voice recognition software. Occasional wrong-word or 'sound-a-like' substitutions may have occurred due to the inherent limitations of voice recognition  software.  Read the chart carefully and recognize, using context, where substitutions may have occurred.

## 2023-11-06 NOTE — Anesthesia Preprocedure Evaluation (Signed)
 Anesthesia Evaluation  Patient identified by MRN, date of birth, ID band Patient awake    Reviewed: Allergy & Precautions, NPO status , Patient's Chart, lab work & pertinent test results  Airway Mallampati: III  TM Distance: >3 FB Neck ROM: full    Dental  (+) Teeth Intact   Pulmonary neg pulmonary ROS   Pulmonary exam normal        Cardiovascular Exercise Tolerance: Good negative cardio ROS Normal cardiovascular exam Rhythm:Regular Rate:Normal     Neuro/Psych negative neurological ROS  negative psych ROS   GI/Hepatic negative GI ROS, Neg liver ROS, hiatal hernia,GERD  Medicated,,  Endo/Other  negative endocrine ROS    Renal/GU negative Renal ROS  negative genitourinary   Musculoskeletal   Abdominal   Peds negative pediatric ROS (+)  Hematology negative hematology ROS (+)   Anesthesia Other Findings Past Medical History: No date: Esophageal reflux No date: Hiatal hernia No date: Peptic stricture of esophagus No date: Schatzki's ring  Past Surgical History: No date: APPENDECTOMY No date: WRIST SURGERY     Comment:  right x3, plate  BMI    Body Mass Index: 32.19 kg/m      Reproductive/Obstetrics negative OB ROS                             Anesthesia Physical Anesthesia Plan  ASA: 2  Anesthesia Plan: General   Post-op Pain Management:    Induction: Intravenous  PONV Risk Score and Plan: Propofol infusion and TIVA  Airway Management Planned: Natural Airway and Nasal Cannula  Additional Equipment:   Intra-op Plan:   Post-operative Plan:   Informed Consent: I have reviewed the patients History and Physical, chart, labs and discussed the procedure including the risks, benefits and alternatives for the proposed anesthesia with the patient or authorized representative who has indicated his/her understanding and acceptance.     Dental Advisory Given  Plan Discussed  with: CRNA  Anesthesia Plan Comments:        Anesthesia Quick Evaluation

## 2023-11-06 NOTE — Op Note (Signed)
 [facility] Gastroenterology Patient Name: Gregory Avila Procedure Date: 11/06/2023 9:50 AM MRN: [id number] Account #: [id number] Date of Birth: 1960-11-04 Admit Type: Outpatient Age: 63 Room: [facility] ENDO ROOM 1 Gender: Male Note Status: Finalized Instrument Name: Peds Colonoscope [id number] Procedure:             Colonoscopy Indications:           Screening for colorectal malignant neoplasm Providers:             [name] DO, DO Referring MD:          Charlie CROME. Bertrum, MD (Referring MD) Medicines:             Monitored Anesthesia Care Complications:         No immediate complications. Estimated blood loss: None. Procedure:             Pre-Anesthesia Assessment:                        - Prior to the procedure, a History and Physical was                         performed, and patient medications and allergies were                         reviewed. The patient is competent. The risks and                         benefits of the procedure and the sedation options and                         risks were discussed with the patient. All questions                         were answered and informed consent was obtained.                         Patient identification and proposed procedure were                         verified by the physician, the nurse, the anesthetist                         and the technician in the endoscopy suite. Mental                         Status Examination: alert and oriented. Airway                         Examination: normal oropharyngeal airway and neck                         mobility. Respiratory Examination: clear to                         auscultation. CV Examination: RRR, no murmurs, no S3                         or S4. Prophylactic Antibiotics: The patient does not  require prophylactic antibiotics. Prior                         Anticoagulants: The patient has taken no anticoagulant                          or antiplatelet agents. ASA Grade Assessment: II - A                         patient with mild systemic disease. After reviewing                         the risks and benefits, the patient was deemed in                         satisfactory condition to undergo the procedure. The                         anesthesia plan was to use monitored anesthesia care                         (MAC). Immediately prior to administration of                         medications, the patient was re-assessed for adequacy                         to receive sedatives. The heart rate, respiratory                         rate, oxygen saturations, blood pressure, adequacy of                         pulmonary ventilation, and response to care were                         monitored throughout the procedure. The physical                         status of the patient was re-assessed after the                         procedure.                        After obtaining informed consent, the colonoscope was                         passed under direct vision. Throughout the procedure,                         the patient's blood pressure, pulse, and oxygen                         saturations were monitored continuously. The                         Colonoscope was introduced through the anus and  advanced to the the terminal ileum, with                         identification of the appendiceal orifice and IC                         valve. The colonoscopy was performed without                         difficulty. The patient tolerated the procedure well.                         The quality of the bowel preparation was evaluated                         using the BBPS [facility] Bowel Preparation Scale) with                         scores of: Right Colon = 3, Transverse Colon = 3 and                         Left Colon = 3 (entire mucosa seen well with no                         residual staining, small fragments  of stool or opaque                         liquid). The total BBPS score equals 9. The terminal                         ileum, ileocecal valve, appendiceal orifice, and                         rectum were photographed. Findings:      The perianal and digital rectal examinations were normal. Pertinent       negatives include normal sphincter tone.      The terminal ileum appeared normal. Estimated blood loss: none.      Retroflexion in the right colon was performed.      The entire examined colon appeared normal on direct and retroflexion       views. Impression:            - The examined portion of the ileum was normal.                        - The entire examined colon is normal on direct and                         retroflexion views.                        - No specimens collected. Recommendation:        - Patient has a contact number available for                         emergencies. The signs and symptoms of potential  delayed complications were discussed with the patient.                         Return to normal activities tomorrow. Written                         discharge instructions were provided to the patient.                        - Discharge patient to home.                        - Resume previous diet.                        - Continue present medications.                        - Repeat colonoscopy in 10 years for screening                         purposes.                        - Return to referring physician as previously                         scheduled.                        - The findings and recommendations were discussed with                         the patient. Procedure Code(s):     --- Professional ---                        [phone], Colonoscopy, flexible; diagnostic, including                         collection of specimen(s) by brushing or washing, when                         performed (separate procedure) Diagnosis Code(s):      --- Professional ---                        Z12.11, Encounter for screening for malignant neoplasm                         of colon CPT copyright 2022 American Medical Association. All rights reserved. The codes documented in this report are preliminary and upon coder review may  be revised to meet current compliance requirements. Attending Participation:      I personally performed the entire procedure. Elspeth Jungling, DO [name] DO, DO 11/06/2023 10:46:50 AM This report has been signed electronically. Number of Addenda: 0 Note Initiated On: 11/06/2023 9:50 AM Scope Withdrawal Time: 0 hours 10 minutes 33 seconds  Total Procedure Duration: 0 hours 13 minutes 4 seconds  Estimated Blood Loss:  Estimated blood loss: none.      [facility]

## 2023-11-06 NOTE — Op Note (Addendum)
 [facility] Gastroenterology Patient Name: Gregory Avila Procedure Date: 11/06/2023 9:52 AM MRN: [id number] Account #: [id number] Date of Birth: 1961-02-24 Admit Type: Outpatient Age: 63 Room: [facility] ENDO ROOM 1 Gender: Male Note Status: Supervisor Override Instrument Name: Upper Endoscope [id number] Procedure:             Upper GI endoscopy Indications:           Dysphagia, Gastro-esophageal reflux disease, Follow-up                         of esophageal stenosis Providers:             Elspeth Ozell Onita ROSALEA, DO Referring MD:          Charlie CROME. Bertrum, MD (Referring MD) Medicines:             Monitored Anesthesia Care Complications:         No immediate complications. Estimated blood loss:                         Minimal. Procedure:             Pre-Anesthesia Assessment:                        - Prior to the procedure, a History and Physical was                         performed, and patient medications and allergies were                         reviewed. The patient is competent. The risks and                         benefits of the procedure and the sedation options and                         risks were discussed with the patient. All questions                         were answered and informed consent was obtained.                         Patient identification and proposed procedure were                         verified by the physician, the nurse, the anesthetist                         and the technician in the endoscopy suite. Mental                         Status Examination: alert and oriented. Airway                         Examination: normal oropharyngeal airway and neck                         mobility. Respiratory Examination: clear to  auscultation. CV Examination: RRR, no murmurs, no S3                         or S4. Prophylactic Antibiotics: The patient does not                         require prophylactic antibiotics. Prior                          Anticoagulants: The patient has taken no anticoagulant                         or antiplatelet agents. ASA Grade Assessment: II - A                         patient with mild systemic disease. After reviewing                         the risks and benefits, the patient was deemed in                         satisfactory condition to undergo the procedure. The                         anesthesia plan was to use monitored anesthesia care                         (MAC). Immediately prior to administration of                         medications, the patient was re-assessed for adequacy                         to receive sedatives. The heart rate, respiratory                         rate, oxygen saturations, blood pressure, adequacy of                         pulmonary ventilation, and response to care were                         monitored throughout the procedure. The physical                         status of the patient was re-assessed after the                         procedure.                        After obtaining informed consent, the endoscope was                         passed under direct vision. Throughout the procedure,                         the patient's blood pressure, pulse, and oxygen  saturations were monitored continuously. The Endoscope                         was introduced through the mouth, and advanced to the                         second part of duodenum. The upper GI endoscopy was                         accomplished without difficulty. The patient tolerated                         the procedure well. Findings:      Localized mild inflammation characterized by congestion (edema) was       found in the duodenal bulb. Biopsies were taken with a cold forceps for       histology. Estimated blood loss was minimal.      The exam of the duodenum was otherwise normal.      Localized moderate inflammation characterized by erosions  and erythema       was found on the greater curvature of the stomach. Biopsies were taken       with a cold forceps for Helicobacter pylori testing. Estimated blood       loss was minimal.      The exam of the stomach was otherwise normal.      The Z-line was regular. Estimated blood loss: none.      Esophagogastric landmarks were identified: the gastroesophageal junction       was found at 40 cm from the incisors.      LA Grade A (one or more mucosal breaks less than 5 mm, not extending       between tops of 2 mucosal folds) esophagitis with no bleeding was found.       Estimated blood loss: none.      One benign-appearing, intrinsic moderate (circumferential scarring or       stenosis; an endoscope may pass) stenosis was found 40 cm from the       incisors. This stenosis measured 1.5 cm (inner diameter) x less than one       cm (in length). The stenosis was traversed. A TTS dilator was passed       through the scope. Dilation with a 15-16.5-18 mm balloon dilator was       performed to 15 mm and 16.5 mm. The dilation site was examined following       endoscope reinsertion and showed mild mucosal disruption. Estimated       blood loss was minimal.      The exam of the esophagus was otherwise normal. Impression:            - Duodenitis. Biopsied.                        - Gastritis. Biopsied.                        - Z-line regular.                        - Esophagogastric landmarks identified.                        -  LA Grade A reflux esophagitis with no bleeding.                        - Benign-appearing esophageal stenosis. Dilated. Recommendation:        - Patient has a contact number available for                         emergencies. The signs and symptoms of potential                         delayed complications were discussed with the patient.                         Return to normal activities tomorrow. Written                         discharge instructions were provided to the  patient.                        - Discharge patient to home.                        - Soft diet today.                        - Continue present medications.                        - No ibuprofen, naproxen, or other non-steroidal                         anti-inflammatory drugs.                        - Resume nightly pantoprazole . Can stop famotidine                        - Await pathology results.                        - Repeat upper endoscopy PRN for retreatment.                        - Return to GI clinic as previously scheduled.                        - The findings and recommendations were discussed with                         the patient. Procedure Code(s):     --- Professional ---                        [phone], Esophagogastroduodenoscopy, flexible,                         transoral; with transendoscopic balloon dilation of                         esophagus (less than 30 mm diameter)  [id number], 59, Esophagogastroduodenoscopy, flexible,                         transoral; with biopsy, single or multiple Diagnosis Code(s):     --- Professional ---                        K29.80, Duodenitis without bleeding                        K29.70, Gastritis, unspecified, without bleeding                        K21.00, Gastro-esophageal reflux disease with                         esophagitis, without bleeding                        K22.2, Esophageal obstruction                        R13.10, Dysphagia, unspecified CPT copyright 2022 American Medical Association. All rights reserved. The codes documented in this report are preliminary and upon coder review may  be revised to meet current compliance requirements. Attending Participation:      I personally performed the entire procedure. Elspeth Jungling, DO [name] DO, DO 11/06/2023 10:26:16 AM This report has been signed electronically. Number of Addenda: 0 Note Initiated On: 11/06/2023 9:52 AM Estimated Blood  Loss:  Estimated blood loss was minimal.      [facility]

## 2023-11-07 LAB — SURGICAL PATHOLOGY
